# Patient Record
Sex: Female | Born: 1990 | Race: Black or African American | Hispanic: No | Marital: Single | Smoking: Current every day smoker
Health system: Southern US, Community
[De-identification: ages and names within clinical notes are randomized; demographics above are authoritative.]

## PROBLEM LIST (undated history)

## (undated) DIAGNOSIS — K219 Gastro-esophageal reflux disease without esophagitis: Secondary | ICD-10-CM

## (undated) DIAGNOSIS — J329 Chronic sinusitis, unspecified: Secondary | ICD-10-CM

## (undated) DIAGNOSIS — L049 Acute lymphadenitis, unspecified: Secondary | ICD-10-CM

## (undated) DIAGNOSIS — I881 Chronic lymphadenitis, except mesenteric: Secondary | ICD-10-CM

## (undated) HISTORY — PX: UVULECTOMY: SHX2631

## (undated) HISTORY — PX: LYMPHADENECTOMY: SHX15

## (undated) HISTORY — DX: Gastro-esophageal reflux disease without esophagitis: K21.9

---
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
## 2010-01-26 ENCOUNTER — Emergency Department (HOSPITAL_COMMUNITY): Admission: EM | Admit: 2010-01-26 | Discharge: 2010-01-27 | Payer: Self-pay | Admitting: Emergency Medicine

## 2010-03-03 ENCOUNTER — Emergency Department (HOSPITAL_COMMUNITY): Admission: EM | Admit: 2010-03-03 | Discharge: 2010-03-03 | Payer: Self-pay | Admitting: Emergency Medicine

## 2010-11-21 ENCOUNTER — Emergency Department (HOSPITAL_COMMUNITY)
Admission: EM | Admit: 2010-11-21 | Discharge: 2010-11-21 | Payer: Self-pay | Source: Home / Self Care | Admitting: Emergency Medicine

## 2010-11-24 LAB — URINALYSIS, ROUTINE W REFLEX MICROSCOPIC
Bilirubin Urine: NEGATIVE
Hgb urine dipstick: NEGATIVE
Ketones, ur: NEGATIVE mg/dL
Nitrite: NEGATIVE
Protein, ur: NEGATIVE mg/dL
Specific Gravity, Urine: 1.024 (ref 1.005–1.030)
Urine Glucose, Fasting: NEGATIVE mg/dL
Urobilinogen, UA: 1 mg/dL (ref 0.0–1.0)
pH: 7.5 (ref 5.0–8.0)

## 2010-11-24 LAB — URINE MICROSCOPIC-ADD ON

## 2010-11-24 LAB — POCT PREGNANCY, URINE: Preg Test, Ur: NEGATIVE

## 2010-11-29 ENCOUNTER — Encounter: Payer: Self-pay | Admitting: Interventional Radiology

## 2011-02-01 LAB — POCT I-STAT, CHEM 8
BUN: 8 mg/dL (ref 6–23)
Calcium, Ion: 1.13 mmol/L (ref 1.12–1.32)
Chloride: 108 mEq/L (ref 96–112)
Creatinine, Ser: 0.6 mg/dL (ref 0.4–1.2)
Glucose, Bld: 83 mg/dL (ref 70–99)
HCT: 40 % (ref 36.0–46.0)
Hemoglobin: 13.6 g/dL (ref 12.0–15.0)
Potassium: 3.8 mEq/L (ref 3.5–5.1)
Sodium: 140 mEq/L (ref 135–145)
TCO2: 26 mmol/L (ref 0–100)

## 2011-02-01 LAB — RPR: RPR Ser Ql: NONREACTIVE

## 2011-02-01 LAB — GC/CHLAMYDIA PROBE AMP, GENITAL
Chlamydia, DNA Probe: NEGATIVE
GC Probe Amp, Genital: NEGATIVE

## 2011-02-01 LAB — URINE CULTURE: Colony Count: 80000

## 2011-02-01 LAB — WET PREP, GENITAL
Clue Cells Wet Prep HPF POC: NONE SEEN
Trich, Wet Prep: NONE SEEN
Yeast Wet Prep HPF POC: NONE SEEN

## 2011-02-01 LAB — POCT PREGNANCY, URINE: Preg Test, Ur: NEGATIVE

## 2011-03-27 NOTE — Op Note (Signed)
[name], [name] NO.:  [id number]   MEDICAL RECORD NO.:  [id number]          PATIENT TYPE:  AMB   LOCATION:  DSC                          FACILITY:  [facility]   PHYSICIAN:  Lucky Cowboy, MD         DATE OF BIRTH:  08-10-1991   DATE OF PROCEDURE:  10/30/2005  DATE OF DISCHARGE:  10/30/2005                                 OPERATIVE REPORT   PREOPERATIVE DIAGNOSIS:  Left posterior cervical lymphadenitis.   POSTOPERATIVE DIAGNOSIS:  Left posterior cervical lymphadenitis.   PROCEDURE:  Excisional biopsy, left posterior cervical lymph node.   SURGEON:  Lucky Cowboy, M.D.   ANESTHESIA:  General endotracheal anesthesia.   ESTIMATED BLOOD LOSS:  10 cc.   SPECIMEN:  Left posterior cervical lymph node.   COMPLICATIONS:  None.   INDICATIONS:  This patient is a 20 year old female who is known to me for a  previous neck mass evaluation. In January of 2004, she was evaluated by  excisional left neck biopsy for ongoing lymphadenitis with fever. This was  returned as consistent with necrotizing lymphadenitis consistent with  Kikuchi's disease.  Eventually, the lymph nodes subsided and the patient's  condition resolved. She presented again on October 08, 2005 with  development of lower back pain and fever to 103. This developed November 14.  She became dehydrated with definite lymphadenopathy. She was then admitted  to the pediatric teaching service where multiple blood work evaluation was  performed. IV antibiotic therapy was also administered. She gradually  improved and was followed back up in the office with near resolution of  lymph nodes. Follow-up CT scan performed on October 26, 2005 revealed  resolution of concern for necrosis with some decrease in the size of the  lymph nodes. There were noted to be some prominent left axillary lymph  nodes, but there was no mediastinal are hilar adenopathy. The night of the  CT scan, the child developed a fever with left leg  swelling and pronounced  left neck adenopathy. She was seen in the office yesterday with 2.5-3-cm  left neck adenopathy from the angle of the mandible down to the  supraclavicular area. She is having low-grade fevers. For these reasons, she  presents for excisional biopsy of left posterior lymph node.   FINDINGS:  The patient was noted to have a necrotic left posterior lymph  node. Multiple other lymph nodes were palpable. This was sent for frozen  section and also permanent section evaluation. The frozen section revealed  findings consistent with necrotizing lymphadenitis as was the previous  diagnosis. This was discussed with Dr. Kieth Brightly, the pathologist. She is  going to further evaluate the remaining pathology specimen and send it for  culture and sensitivity as well as well as look for TB and other workup for  the necrotizing lymphadenitis.   DESCRIPTION OF PROCEDURE:  The patient was taken to the operating room and  placed on the table in the supine position. She was then placed under  general endotracheal anesthesia and the left neck prepped with Betadine and  draped in the usual sterile  fashion. A transverse 3-cm incision was made  using a #15 blade. Subcutaneous bleeding was controlled using Bovie cautery.  Scissors dissection was used for the remainder of the procedure. The spinal  accessory nerve was identified and preserved. The necrotic posterior  cervical lymph node was identified. It could not be dissected in whole due  to the necrotic nature. The specimens were obtained and taken for frozen  section. A larger 1-cm lymph node was obtained in this region and sent for  permanent section for lymphoma protocol. Other parcels of the lymph node  were also included in this specimen. Once the lymph node was cleaned out its  entirety, bipolar cautery was used to ensure hemostasis. The nerve was  protected its entirety. The subcutaneous tissues were reapproximated in a  simple  interrupted buried fashion. This was done with a 4-0 Vicryl. The skin  was closed in a simple running stitch using 4-0 Prolene. Bacitracin was  applied. The patient was awakened from anesthesia and taken to the  Postanesthesia Care Unit in stable condition. There were no complications.      Lucky Cowboy, MD  Electronically Signed     SJ/MEDQ  D:  10/30/2005  T:  11/02/2005  Job:  [id number]   cc:   [facility]   Marda Stalker, M.D.   Gerrianne Scale, M.D.  Fax: [phone]   Irineo Axon, M.D.  Pediatric Infectious Disease  [facility]  [facility]

## 2011-03-27 NOTE — Discharge Summary (Signed)
NAMESHIANE, WENBERG NO.:  [id number]   MEDICAL RECORD NO.:  [id number]          PATIENT TYPE:  INP   LOCATION:  6148                         FACILITY:  [facility]   PHYSICIAN:  Clint Lipps, PediatricsDATE OF BIRTH:  Jul 24, 1991   DATE OF ADMISSION:  10/08/2005  DATE OF DISCHARGE:  10/12/2005                                 DISCHARGE SUMMARY   Dictation ended at this point.           ______________________________  Clint Lipps, Pediatrics     SK/MEDQ  D:  10/12/2005  T:  10/13/2005  Job:  [id number]

## 2011-03-27 NOTE — Discharge Summary (Signed)
[name], Burnett NO.:  [id number]   MEDICAL RECORD NO.:  [id number]          PATIENT TYPE:  INP   LOCATION:  6148                         FACILITY:  [facility]   PHYSICIAN:  Clint Lipps, M.D.    DATE OF BIRTH:  [birth year]   DATE OF ADMISSION:  10/08/2005  DATE OF DISCHARGE:  10/12/2005                                 DISCHARGE SUMMARY   REASON FOR HOSPITALIZATION:  Fever of unknown origin, cervical  lymphadenopathy, and weight loss of ten pounds for 12 days.   HOSPITAL COURSE:  LABORATORY DATA:  CBC:  White blood count 3.9, hemoglobin  11.1, hematocrit 32.7, platelets 265,000, neutrophils 39%, lymphocytes 46%,  monocytes 15%.  Chem-10 with LFT, total bilirubin, total protein, albumin,  uric acid, ACE, T4, TSH, CRP within normal limits.  ESR 56.  Urinalysis:  pH  6.5, specific gravity 1.040, moderate leukocytes, ketones, high  urobilinogen, few bacteria.  TB skin test negative.  Rheumatoid factor  elevated with 53, AMA positive with a titer of 1:80, speckled pattern.  EBV  studies negative.  RNP antibodies, EGG, SSB (LA antibodies), scleroderma  antibodies, JO-1 antibodies negative.  SSA (RO antibodies) greater than 8.0  (range less than 1.0.)  Blood culture from October 08, 2005, pending.  Urine culture from October 08, 2005, shows 80,000 colonies/ml (clean  catch).   After she was found to have a pneumomediastinum on CT of the neck on  October 08, 2005, that was performed to evaluate her cervical  lymphadenopathy, she was admitted to the Pediatric Service at [facility].  A  chest x-ray, PA and lateral views, showed no active cardiopulmonary disease  and no pneumomediastinum.   On admission, her temperature was 39.3 degrees Celsius, which resolved  spontaneously without medication.  The last time she was febrile was around  1:00 a.m. on October 10, 2005, with a temperature of 38.2 degrees Celsius  which responded to Tylenol.  She also received maintenance  intravenous  fluids initially due to decreased appetite and decreased p.o. intake for  days.   Her labs were remarkable for an elevated __________, positive rheumatoid  factor, antinuclear antibodies with speckled pattern, and SSA (RO  antibodies) greater than 8.0.  Other differential diagnoses like thyroid  disorder, tuberculosis, or sarcoidosis appear to be less likely based on the  laboratory results.   [name] underwent an ultrasound-guided cervical lymph node biopsy on the left  on October 12, 2005.  Culture and pathology results pending.   FINAL DIAGNOSES:  1.  Fever of unknown origin, cervical lymphadenopathy, and weight loss for      12 days.  2.  Status post surgical removal of necrotic uvula and history of __________      disease in January 2004.  3.  Culture and pathology results from lymph node biopsy are pending.   DISCHARGE MEDICATIONS AND INSTRUCTIONS:  1.  Tylenol 650 mg by mouth every 6 hours p.r.n. for pain control after      biopsy.  2.  Follow-up appointments.  She will be excused from school until her      follow-up  appointment with Dr. Orson Aloe on October 14, 2005, that the      family will schedule.  She will also have follow-up appointments with      Dr. Lucky Cowboy from ENT and a rheumatologist to further evaluate those      above-mentioned laboratory      findings.  ___________.  We will arrange for follow-up appointments      tomorrow and notify the family.  3.  Discharge weight:  63.2 kg.   DISCHARGE CONDITION:  Improved.  No fevers since October 10, 2005, good p.o.  intake.           ______________________________  Clint Lipps, M.D.     SK/MEDQ  D:  10/12/2005  T:  10/13/2005  Job:  [id number]   cc:   Geri Seminole, MD  Fax: [phone]   Rheumatologist to be named

## 2011-04-04 ENCOUNTER — Emergency Department (HOSPITAL_COMMUNITY)
Admission: EM | Admit: 2011-04-04 | Discharge: 2011-04-04 | Disposition: A | Discharge status: Home / Self Care | Payer: Self-pay | Attending: Emergency Medicine | Admitting: Emergency Medicine

## 2011-04-04 DIAGNOSIS — M79609 Pain in unspecified limb: Secondary | ICD-10-CM | POA: Insufficient documentation

## 2011-04-04 LAB — POCT I-STAT, CHEM 8
BUN: <3 mg/dL — ABNORMAL LOW (ref 6–23)
Calcium, Ion: 1.09 mmol/L — ABNORMAL LOW (ref 1.12–1.32)
Chloride: 106 mEq/L (ref 96–112)
Creatinine, Ser: 0.7 mg/dL (ref 0.4–1.2)
Glucose, Bld: 90 mg/dL (ref 70–99)
HCT: 39 % (ref 36.0–46.0)
Hemoglobin: 13.3 g/dL (ref 12.0–15.0)
Potassium: 3.5 mEq/L (ref 3.5–5.1)
Sodium: 142 mEq/L (ref 135–145)
TCO2: 24 mmol/L (ref 0–100)

## 2012-06-08 ENCOUNTER — Emergency Department (HOSPITAL_COMMUNITY)
Admission: EM | Admit: 2012-06-08 | Discharge: 2012-06-08 | Disposition: A | Discharge status: Home / Self Care | Payer: Self-pay | Attending: Emergency Medicine | Admitting: Emergency Medicine

## 2012-06-08 ENCOUNTER — Encounter (HOSPITAL_COMMUNITY): Payer: Self-pay

## 2012-06-08 DIAGNOSIS — F17200 Nicotine dependence, unspecified, uncomplicated: Secondary | ICD-10-CM | POA: Insufficient documentation

## 2012-06-08 DIAGNOSIS — R599 Enlarged lymph nodes, unspecified: Secondary | ICD-10-CM

## 2012-06-08 DIAGNOSIS — R42 Dizziness and giddiness: Secondary | ICD-10-CM | POA: Insufficient documentation

## 2012-06-08 DIAGNOSIS — R591 Generalized enlarged lymph nodes: Secondary | ICD-10-CM

## 2012-06-08 MED ORDER — HYDROCODONE-ACETAMINOPHEN 5-325 MG PO TABS: 1.0000 | ORAL_TABLET | ORAL | Status: AC | PRN

## 2012-06-08 NOTE — ED Notes (Signed)
Patient c/o sore throat and swelling, frequent dizziness, and one syncopal episode. 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 No problems with breathing.

## 2012-06-08 NOTE — ED Provider Notes (Signed)
History     CSN: [id number]  Arrival date & time 06/08/12  1231   First MD Initiated Contact with Patient 06/08/12 1325      Chief Complaint  Patient presents with  . Oral Swelling    throat lymph node swelling  . Dizziness    (Consider location/radiation/quality/duration/timing/severity/associated sxs/prior treatment) HPI Comments: [name] is a 21 y.o. Female  who presents with several days of swelling and nodules in her left neck. She has mild, associated dizziness and weakness. She's not had any syncope. She is eating well. She has no fever, or chills, nausea, or vomiting. She has a history of a benign cervical adenopathy. 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 She's tried over-the-counter analgesics for relief. The   The history is provided by the patient.    History reviewed. No pertinent past medical history.  Past Surgical History  Procedure Date  . Uvulectomy   . Lymphadenectomy     Family History  Problem Relation Age of Onset  . Diabetes Mother   . Hypertension Father   . Cancer Brother   . Heart failure Other     History  Substance Use Topics  . Smoking status: Current Everyday Smoker -- 0.5 packs/day    Types: Cigarettes  . Smokeless tobacco: Never Used  . Alcohol Use: Yes     occasinally    OB History    Grav Para Term Preterm Abortions TAB SAB Ect Mult Living                  Review of Systems  All other systems reviewed and are negative.    Allergies  Review of patient's allergies indicates no known allergies.  Home Medications   Current Outpatient Rx  Name Route Sig Dispense Refill  . ACETAMINOPHEN 500 MG PO TABS Oral Take 1,000 mg by mouth every 4 (four) hours as needed. For pain.    . BC HEADACHE POWDER PO Oral Take 1 packet by mouth every 4 (four) hours as needed. For pain.    Marland Kitchen ANIMAL SHAPES WITH C & FA PO CHEW Oral Chew 1 tablet by mouth daily.    Marland Kitchen HYDROCODONE-ACETAMINOPHEN 5-325 MG PO TABS Oral Take 1 tablet by mouth  every 4 (four) hours as needed for pain. 20 tablet 0    BP 122/79  Pulse 78  Temp 98.1 F (36.7 C) (Oral)  Resp 16  Ht 5\' 8"  (1.727 m)  SpO2 100%  LMP 05/14/2012  Physical Exam  Nursing note and vitals reviewed. Constitutional: She is oriented to person, place, and time. She appears well-developed and well-nourished.  HENT:  Head: Normocephalic and atraumatic.       No trismus  Eyes: Conjunctivae and EOM are normal. Pupils are equal, round, and reactive to light.  Neck: Normal range of motion and phonation normal. Neck supple.  Cardiovascular: Normal rate, regular rhythm and intact distal pulses.   Pulmonary/Chest: Effort normal and breath sounds normal. She exhibits no tenderness.  Abdominal: Soft.  Musculoskeletal: Normal range of motion.       Normal neck and back motion  Lymphadenopathy:       Left neck adenopathy ranging from 2-4 cm, multiple, without overlying skin defect, or significant tenderness to palpation.  Neurological: She is alert and oriented to person, place, and time. She has normal strength. She exhibits normal muscle tone.  Skin: Skin is warm and dry.  Psychiatric: She has a normal mood and affect. Her behavior is normal. Judgment and thought  content normal.    ED Course  Procedures (including critical care time)        Labs Reviewed - No data to display No results found.   1. 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 The patient is nontoxic, with normal vital signs, and no worrisome complaints. She is stable for discharge.      Plan: Home Medications- Norco; Home Treatments- rest; Recommended follow up- her prior doctor  Flint Melter, MD 06/08/12 1609

## 2012-08-01 ENCOUNTER — Encounter (HOSPITAL_COMMUNITY): Payer: Self-pay | Admitting: *Deleted

## 2012-08-01 ENCOUNTER — Emergency Department (INDEPENDENT_AMBULATORY_CARE_PROVIDER_SITE_OTHER)
Admission: EM | Admit: 2012-08-01 | Discharge: 2012-08-01 | Disposition: A | Discharge status: Home / Self Care | Payer: Self-pay | Source: Home / Self Care | Attending: Family Medicine | Admitting: Family Medicine

## 2012-08-01 DIAGNOSIS — R42 Dizziness and giddiness: Secondary | ICD-10-CM

## 2012-08-01 LAB — POCT URINALYSIS DIP (DEVICE)
Glucose, UA: 100 mg/dL — AB
Hgb urine dipstick: NEGATIVE
Ketones, ur: 40 mg/dL — AB
Leukocytes, UA: NEGATIVE
Nitrite: NEGATIVE
Protein, ur: 30 mg/dL — AB
Specific Gravity, Urine: 1.025 (ref 1.005–1.030)
Urobilinogen, UA: 2 mg/dL — ABNORMAL HIGH (ref 0.0–1.0)
pH: 6.5 (ref 5.0–8.0)

## 2012-08-01 LAB — POCT PREGNANCY, URINE: Preg Test, Ur: NEGATIVE

## 2012-08-01 MED ORDER — MECLIZINE HCL 25 MG PO TABS: 25.0000 mg | ORAL_TABLET | Freq: Four times a day (QID) | ORAL | Status: DC

## 2012-08-01 NOTE — ED Notes (Signed)
Pt reports      symotons   Of  Dizzyness  With  Pain r  Shoulder          For  What   She  Describes  As  sev   Months    She  Also  Reports  She    May not be drinking  Enough  Water  According to  Patients  Mother  -   The patient is  Awake  As  Well  As  Alert   She is  Oriented   Her  Skin is  Warm  And  Dry  She  Reports  Her  Period is  Late and  It is  Irregular

## 2012-08-01 NOTE — ED Provider Notes (Signed)
History     CSN: [id number]  Arrival date & time 08/01/12  1526   First MD Initiated Contact with Patient 08/01/12 1639      Chief Complaint  Patient presents with  . Dizziness    (Consider location/radiation/quality/duration/timing/severity/associated sxs/prior treatment) HPI Comments: C/o brief episodes of dizziness over the past month.  2-3 episodes daily lasting a few seconds.  No recent fever or viral sxs..  No head trauma.    No nausea , vomiting or diarrhea.  "not drinking as much water as i used to".    No UTI sxs.  The history is provided by the patient. No language interpreter was used.    History reviewed. No pertinent past medical history.  Past Surgical History  Procedure Date  . Uvulectomy   . Lymphadenectomy     Family History  Problem Relation Age of Onset  . Diabetes Mother   . Hypertension Father   . Cancer Brother   . Heart failure Other     History  Substance Use Topics  . Smoking status: Current Every Day Smoker -- 0.5 packs/day    Types: Cigarettes  . Smokeless tobacco: Never Used  . Alcohol Use: Yes     occasinally    OB History    Grav Para Term Preterm Abortions TAB SAB Ect Mult Living                  Review of Systems  Constitutional: Negative for fever and chills.  HENT: Negative for ear pain, neck pain and ear discharge.   Gastrointestinal: Negative for nausea, vomiting, abdominal pain and constipation.  Genitourinary: Negative for dysuria, urgency, frequency and hematuria.  Neurological: Positive for dizziness and light-headedness. Negative for seizures, syncope, facial asymmetry, speech difficulty, weakness and headaches.  All other systems reviewed and are negative.    Allergies  Review of patient's allergies indicates no known allergies.  Home Medications   Current Outpatient Rx  Name Route Sig Dispense Refill  . ACETAMINOPHEN 500 MG PO TABS Oral Take 1,000 mg by mouth every 4 (four) hours as needed. For pain.    .  BC HEADACHE POWDER PO Oral Take 1 packet by mouth every 4 (four) hours as needed. For pain.    Marland Kitchen ANIMAL SHAPES WITH C & FA PO CHEW Oral Chew 1 tablet by mouth daily.      BP 98/68  Pulse 104  Temp 99.1 F (37.3 C) (Oral)  Resp 18  SpO2 100%  LMP 05/09/2012  Physical Exam  Nursing note and vitals reviewed. Constitutional: She is oriented to person, place, and time. She appears well-developed and well-nourished. No distress.  HENT:  Head: Normocephalic and atraumatic.  Right Ear: External ear normal.  Left Ear: External ear normal.  Eyes: EOM are normal. Right eye exhibits no nystagmus. Left eye exhibits no nystagmus.       hallpike maneuver negative x 2   Neck: Normal range of motion.  Cardiovascular: Normal rate, regular rhythm and normal heart sounds.   Pulmonary/Chest: Effort normal and breath sounds normal.  Abdominal: Soft. She exhibits no distension. There is no tenderness.  Musculoskeletal: Normal range of motion.  Neurological: She is alert and oriented to person, place, and time. No cranial nerve deficit or sensory deficit. Coordination and gait normal. GCS eye subscore is 4. GCS verbal subscore is 5. GCS motor subscore is 6.  Skin: Skin is warm and dry.  Psychiatric: She has a normal mood and affect. Judgment normal.  ED Course  Procedures (including critical care time)  Labs Reviewed  POCT URINALYSIS DIP (DEVICE) - Abnormal; Notable for the following:    Glucose, UA 100 (*)     Bilirubin Urine MODERATE (*)     Ketones, ur 40 (*)     Protein, ur 30 (*)     Urobilinogen, UA 2.0 (*)     All other components within normal limits  POCT PREGNANCY, URINE   No results found.   1. Vertigo       MDM  rx-antivert 25 mg, 28 Increase fluid intake.   Return if sxs worsen or change significantly.        Evalina Field, [place] 08/01/12 1728

## 2012-08-05 NOTE — ED Provider Notes (Signed)
Medical screening examination/treatment/procedure(s) were performed by resident physician or non-physician practitioner and as supervising physician I was immediately available for consultation/collaboration.   Barkley Bruns MD.    Linna Hoff, MD 08/05/12 [phone]

## 2013-11-10 ENCOUNTER — Emergency Department (HOSPITAL_COMMUNITY): Payer: Self-pay

## 2013-11-10 ENCOUNTER — Encounter (HOSPITAL_COMMUNITY): Payer: Self-pay | Admitting: Emergency Medicine

## 2013-11-10 ENCOUNTER — Emergency Department (HOSPITAL_COMMUNITY)
Admission: EM | Admit: 2013-11-10 | Discharge: 2013-11-10 | Disposition: A | Discharge status: Home / Self Care | Payer: Self-pay | Attending: Emergency Medicine | Admitting: Emergency Medicine

## 2013-11-10 DIAGNOSIS — R69 Illness, unspecified: Secondary | ICD-10-CM

## 2013-11-10 DIAGNOSIS — F17200 Nicotine dependence, unspecified, uncomplicated: Secondary | ICD-10-CM | POA: Insufficient documentation

## 2013-11-10 DIAGNOSIS — Z862 Personal history of diseases of the blood and blood-forming organs and certain disorders involving the immune mechanism: Secondary | ICD-10-CM | POA: Insufficient documentation

## 2013-11-10 DIAGNOSIS — R52 Pain, unspecified: Secondary | ICD-10-CM | POA: Insufficient documentation

## 2013-11-10 DIAGNOSIS — IMO0001 Reserved for inherently not codable concepts without codable children: Secondary | ICD-10-CM | POA: Insufficient documentation

## 2013-11-10 DIAGNOSIS — R110 Nausea: Secondary | ICD-10-CM | POA: Insufficient documentation

## 2013-11-10 DIAGNOSIS — R51 Headache: Secondary | ICD-10-CM | POA: Insufficient documentation

## 2013-11-10 DIAGNOSIS — R599 Enlarged lymph nodes, unspecified: Secondary | ICD-10-CM | POA: Insufficient documentation

## 2013-11-10 DIAGNOSIS — J111 Influenza due to unidentified influenza virus with other respiratory manifestations: Secondary | ICD-10-CM

## 2013-11-10 DIAGNOSIS — Z79899 Other long term (current) drug therapy: Secondary | ICD-10-CM | POA: Insufficient documentation

## 2013-11-10 HISTORY — DX: Acute lymphadenitis, unspecified: L04.9

## 2013-11-10 HISTORY — DX: Chronic lymphadenitis, except mesenteric: I88.1

## 2013-11-10 LAB — BASIC METABOLIC PANEL
BUN: 10 mg/dL (ref 6–23)
CALCIUM: 9 mg/dL (ref 8.4–10.5)
CO2: 21 meq/L (ref 19–32)
Chloride: 101 mEq/L (ref 96–112)
Creatinine, Ser: 0.77 mg/dL (ref 0.50–1.10)
GFR calc Af Amer: >90 mL/min (ref >90)
GFR calc non Af Amer: >90 mL/min (ref >90)
GLUCOSE: 83 mg/dL (ref 70–99)
Potassium: 4.5 mEq/L (ref 3.7–5.3)
SODIUM: 136 meq/L — AB (ref 137–147)

## 2013-11-10 LAB — CBC WITH DIFFERENTIAL/PLATELET
Basophils Absolute: 0 10*3/uL (ref 0.0–0.1)
Basophils Relative: 1 % (ref 0–1)
EOS PCT: 1 % (ref 0–5)
Eosinophils Absolute: 0 10*3/uL (ref 0.0–0.7)
HEMATOCRIT: 38.3 % (ref 36.0–46.0)
Hemoglobin: 12.4 g/dL (ref 12.0–15.0)
LYMPHS ABS: 1.4 10*3/uL (ref 0.7–4.0)
LYMPHS PCT: 37 % (ref 12–46)
MCH: 24.9 pg — ABNORMAL LOW (ref 26.0–34.0)
MCHC: 32.4 g/dL (ref 30.0–36.0)
MCV: 76.9 fL — AB (ref 78.0–100.0)
MONO ABS: 0.4 10*3/uL (ref 0.1–1.0)
Monocytes Relative: 9 % (ref 3–12)
Neutro Abs: 1.9 10*3/uL (ref 1.7–7.7)
Neutrophils Relative %: 52 % (ref 43–77)
PLATELETS: 196 10*3/uL (ref 150–400)
RBC: 4.98 MIL/uL (ref 3.87–5.11)
RDW: 14.3 % (ref 11.5–15.5)
WBC: 3.7 10*3/uL — AB (ref 4.0–10.5)

## 2013-11-10 LAB — CG4 I-STAT (LACTIC ACID): Lactic Acid, Venous: 0.94 mmol/L (ref 0.5–2.2)

## 2013-11-10 MED ORDER — BENZONATATE 100 MG PO CAPS: 100.0000 mg | ORAL_CAPSULE | Freq: Three times a day (TID) | ORAL | Status: DC

## 2013-11-10 MED ORDER — NAPROXEN 500 MG PO TABS: 500.0000 mg | ORAL_TABLET | Freq: Two times a day (BID) | ORAL | Status: DC

## 2013-11-10 MED ORDER — ACETAMINOPHEN 325 MG PO TABS
650.0000 mg | ORAL_TABLET | Freq: Once | ORAL | Status: AC
  Administered 2013-11-10: 650 mg via ORAL
  Filled 2013-11-10: qty 2

## 2013-11-10 NOTE — Discharge Instructions (Signed)
Please read and follow all provided instructions.  Your diagnoses today include:  1. Influenza-like illness     Tests performed today include:  Blood counts and electrolytes - normal  Chest x-ray - no pneumonia  Vital signs. See below for your results today.   Medications prescribed:   Tessalon Perles - cough suppressant medication   Naproxen - anti-inflammatory pain medication  Do not exceed 500mg  naproxen every 12 hours, take with food  You have been prescribed an anti-inflammatory medication or NSAID. Take with food. Take smallest effective dose for the shortest duration needed for your pain. Stop taking if you experience stomach pain or vomiting.   Take any prescribed medications only as directed.  Home care instructions:  Follow any educational materials contained in this packet. Please continue drinking plenty of fluids. Use over-the-counter cold and flu medications as needed as directed on packaging for symptom relief. You may also use ibuprofen or tylenol as directed on packaging for pain or fever.   BE VERY CAREFUL not to take multiple medicines containing Tylenol (also called acetaminophen). Doing so can lead to an overdose which can damage your liver and cause liver failure and possibly death.   Follow-up instructions: Please follow-up with your primary care provider in the next 3 days for further evaluation of your symptoms. If you do not have a primary care doctor -- see below for referral information.   Return instructions:   Please return to the Emergency Department if you experience worsening symptoms.  Please return if you have a high fever greater than 101 degrees not controlled with over-the-counter medications, persistent vomiting and cannot keep down fluids, or worsening trouble breathing.  Please return if you have any other emergent concerns.  Additional Information:  Your vital signs today were: BP 122/72   Pulse 108   Temp(Src) 100.9 F (38.3 C)  (Oral)   Resp 20   Wt 209 lb 2 oz (94.858 kg)   SpO2 100%   LMP 09/07/2013 If your blood pressure (BP) was elevated above 135/85 this visit, please have this repeated by your doctor within one month.

## 2013-11-10 NOTE — ED Notes (Signed)
Pt reports has had migraine for few weeks. 1 week ago began to have cold, cough, congestion, body aches and fever. Pt has hx of autoimmune disease and states her body can't fight off infection as well. Also reports nausea and diarrhea x 2 days.

## 2013-11-10 NOTE — ED Provider Notes (Signed)
CSN: [id number]     Arrival date & time 11/10/13  1247 History   First MD Initiated Contact with Patient 11/10/13 1403     Chief Complaint  Patient presents with  . Fever  . 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 She has had nausea and diarrhea for the past 2 days. She first was told she had a fever today. Upon arrival to the emergency department, her temperature was 100.18F. Patient has been using over-the-counter Tylenol and aspirin for headache. She has not vomited. No urinary symptoms. She did not have a flu shot this year. Onset of symptoms gradual. Course is constant. Nothing makes symptoms better or worse.  Patient is a 23 y.o. female presenting with fever. The history is provided by the patient.  Fever Associated symptoms: chills, congestion, cough, headaches, myalgias, nausea and rhinorrhea   Associated symptoms: no diarrhea, no dysuria, no ear pain, no rash, no sore throat and no vomiting     Past Medical History  Diagnosis Date  . Kikuchi disease    Past Surgical History  Procedure Laterality Date  . Uvulectomy    . Lymphadenectomy     Family History  Problem Relation Age of Onset  . Diabetes Mother   . Hypertension Father   . Cancer Brother   . Heart failure Other    History  Substance Use Topics  . Smoking status: Current Every Day Smoker -- 0.50 packs/day    Types: Cigarettes  . Smokeless tobacco: Never Used  . Alcohol Use: Yes     Comment: occasinally   OB History   Grav Para Term Preterm Abortions TAB SAB Ect Mult Living                 Review of Systems  Constitutional: Positive for fever, chills and appetite change. Negative for fatigue.  HENT: Positive for congestion and rhinorrhea. Negative for ear pain, sinus  pressure and sore throat.   Eyes: Negative for redness.  Respiratory: Positive for cough. Negative for shortness of breath and wheezing.   Gastrointestinal: Positive for nausea. Negative for vomiting, abdominal pain and diarrhea.  Genitourinary: Negative for dysuria.  Musculoskeletal: Positive for myalgias. Negative for neck stiffness.  Skin: Negative for rash.  Neurological: Positive for headaches.  Hematological: Positive for adenopathy (stable).    Allergies  Review of patient's allergies indicates no known allergies.  Home Medications   Current Outpatient Rx  Name  Route  Sig  Dispense  Refill  . acetaminophen (TYLENOL) 500 MG tablet   Oral   Take 1,000 mg by mouth every 4 (four) hours as needed. For pain.         . Aspirin-Salicylamide-Caffeine (BC HEADACHE POWDER PO)   Oral   Take 1 packet by mouth every 4 (four) hours as needed. For pain.         . meclizine (ANTIVERT) 25 MG tablet   Oral   Take 1 tablet (25 mg total) by mouth 4 (four) times daily.   28 tablet   0   . Pediatric Multiple Vit-C-FA (MULTIVITAMIN ANIMAL SHAPES, WITH CA/FA,) WITH C & FA CHEW   Oral   Chew 1 tablet by mouth daily.          BP 122/72  Pulse 108  Temp(Src) 100.9 F (38.3 C) (Oral)  Resp 20  Wt 209 lb  2 oz (94.858 kg)  SpO2 100% Physical Exam  Nursing note and vitals reviewed. Constitutional: She appears well-developed and well-nourished.  HENT:  Head: Normocephalic and atraumatic.  Right Ear: Tympanic membrane, external ear and ear canal normal.  Left Ear: Tympanic membrane, external ear and ear canal normal.  Nose: Mucosal edema and rhinorrhea present.  Mouth/Throat: Uvula is midline, oropharynx is clear and moist and mucous membranes are normal. No oropharyngeal exudate, posterior oropharyngeal edema, posterior oropharyngeal erythema or tonsillar abscesses.  Eyes: Conjunctivae are normal. Right eye exhibits no discharge. Left eye exhibits no discharge.  Neck: Normal range  of motion. Neck supple.  Cardiovascular: Normal rate, regular rhythm and normal heart sounds.   Pulmonary/Chest: Effort normal and breath sounds normal.  Abdominal: Soft. There is no tenderness.  Neurological: She is alert.  Skin: Skin is warm and dry.  Psychiatric: She has a normal mood and affect.    ED Course  Procedures (including critical care time) Labs Review Labs Reviewed  CBC WITH DIFFERENTIAL - Abnormal; Notable for the following:    WBC 3.7 (*)    MCV 76.9 (*)    MCH 24.9 (*)    All other components within normal limits  BASIC METABOLIC PANEL - Abnormal; Notable for the following:    Sodium 136 (*)    All other components within normal limits  CG4 I-STAT (LACTIC ACID)   Imaging Review Dg Chest 2 View  11/10/2013   CLINICAL DATA:  Fever, generalized body aches  EXAM: CHEST  2 VIEW  COMPARISON:  CT chest 10/26/2005  FINDINGS: The heart size and mediastinal contours are within normal limits. Both lungs are clear. The visualized skeletal structures are unremarkable.  IMPRESSION: No active cardiopulmonary disease.   Electronically Signed   By: Elige Ko   On: 11/10/2013 14:53    EKG Interpretation   None      2:15 PM Patient seen and examined. Work-up initiated. Medications ordered.   Vital signs reviewed and are as follows: Filed Vitals:   11/10/13 1252  BP: 122/72  Pulse: 108  Temp: 100.9 F (38.3 C)  Resp: 20   Pt informed of results. She is doing well. Tylenol given prior to discharge.   Patient counseled on supportive care for influenza and s/s to return including worsening symptoms, persistent fever, persistent vomiting, or if they have any other concerns.  Urged to see PCP if symptoms persist for more than 3 days. Patient verbalizes understanding and agrees with plan.   MDM   1. Influenza-like illness    Patient with symptoms consistent with influenza.  Vitals are stable, low-grade fever.  No signs of dehydration, tolerating PO's.  Lungs are clear.   Supportive therapy indicated with return if symptoms worsen.  Patient counseled.  Tamiflu not given due to symptoms > 48 hrs, unproven efficacy. Patient has reported autoimmune dx but she is not on any immunosuppressants.   She appears well, non-toxic, appropriate for supportive care as outpatient.       Renne Crigler, PA-C 11/11/13 [phone]

## 2013-11-13 NOTE — ED Provider Notes (Signed)
Medical screening examination/treatment/procedure(s) were performed by non-physician practitioner and as supervising physician I was immediately available for consultation/collaboration.  EKG Interpretation   None         Audree CamelScott T Shan Valdes, MD 11/13/13 [phone]

## 2013-11-19 ENCOUNTER — Emergency Department (HOSPITAL_COMMUNITY)
Admission: EM | Admit: 2013-11-19 | Discharge: 2013-11-20 | Disposition: A | Discharge status: Home / Self Care | Payer: Self-pay | Attending: Emergency Medicine | Admitting: Emergency Medicine

## 2013-11-19 ENCOUNTER — Emergency Department (HOSPITAL_COMMUNITY): Payer: Self-pay

## 2013-11-19 ENCOUNTER — Encounter (HOSPITAL_COMMUNITY): Payer: Self-pay | Admitting: Emergency Medicine

## 2013-11-19 DIAGNOSIS — R51 Headache: Secondary | ICD-10-CM

## 2013-11-19 DIAGNOSIS — R519 Headache, unspecified: Secondary | ICD-10-CM

## 2013-11-19 DIAGNOSIS — J323 Chronic sphenoidal sinusitis: Secondary | ICD-10-CM | POA: Insufficient documentation

## 2013-11-19 DIAGNOSIS — Z862 Personal history of diseases of the blood and blood-forming organs and certain disorders involving the immune mechanism: Secondary | ICD-10-CM | POA: Insufficient documentation

## 2013-11-19 DIAGNOSIS — Z79899 Other long term (current) drug therapy: Secondary | ICD-10-CM | POA: Insufficient documentation

## 2013-11-19 DIAGNOSIS — Z791 Long term (current) use of non-steroidal anti-inflammatories (NSAID): Secondary | ICD-10-CM | POA: Insufficient documentation

## 2013-11-19 DIAGNOSIS — J322 Chronic ethmoidal sinusitis: Secondary | ICD-10-CM | POA: Insufficient documentation

## 2013-11-19 DIAGNOSIS — J329 Chronic sinusitis, unspecified: Secondary | ICD-10-CM

## 2013-11-19 DIAGNOSIS — R42 Dizziness and giddiness: Secondary | ICD-10-CM | POA: Insufficient documentation

## 2013-11-19 DIAGNOSIS — F17200 Nicotine dependence, unspecified, uncomplicated: Secondary | ICD-10-CM | POA: Insufficient documentation

## 2013-11-19 MED ORDER — DIPHENHYDRAMINE HCL 50 MG/ML IJ SOLN
25.0000 mg | Freq: Once | INTRAMUSCULAR | Status: AC
  Administered 2013-11-19: 25 mg via INTRAVENOUS
  Filled 2013-11-19: qty 1

## 2013-11-19 MED ORDER — AMOXICILLIN-POT CLAVULANATE 500-125 MG PO TABS
1.0000 | ORAL_TABLET | Freq: Three times a day (TID) | ORAL | Status: DC
Start: 2013-11-19 — End: 2013-12-11

## 2013-11-19 MED ORDER — PROCHLORPERAZINE EDISYLATE 5 MG/ML IJ SOLN
10.0000 mg | Freq: Once | INTRAMUSCULAR | Status: AC
  Administered 2013-11-19: 10 mg via INTRAVENOUS
  Filled 2013-11-19: qty 2

## 2013-11-19 MED ORDER — IBUPROFEN 600 MG PO TABS: 600.0000 mg | ORAL_TABLET | Freq: Four times a day (QID) | ORAL | Status: DC | PRN

## 2013-11-19 MED ORDER — ACETAMINOPHEN 500 MG PO TABS
1000.0000 mg | ORAL_TABLET | Freq: Once | ORAL | Status: AC
  Administered 2013-11-19: 1000 mg via ORAL
  Filled 2013-11-19: qty 2

## 2013-11-19 MED ORDER — SODIUM CHLORIDE 0.9 % IV BOLUS (SEPSIS)
1000.0000 mL | INTRAVENOUS | Status: AC
  Administered 2013-11-19: 1000 mL via INTRAVENOUS

## 2013-11-19 NOTE — ED Notes (Signed)
[facility] for the same complaint. Pt has not improved.  Pt c/o of nausea after eating only.  EDPA Erin in to see pt.  Pt did not go to work today. Because of HA

## 2013-11-19 NOTE — ED Provider Notes (Signed)
CSN: [id number]     Arrival date & time 11/19/13  2008 History   First MD Initiated Contact with Patient 11/19/13 2041     Chief Complaint  Patient presents with  . Headache   (Consider location/radiation/quality/duration/timing/severity/associated sxs/prior Treatment) HPI Pt is a 22yo female with hx of Kikuchi disease when she was younger, advised at that time she would grow out of it.  Today, c/o 3-4 week hx of intermittent, gradually worsening frontal headaches that are throbbing in nature, 8/10.  Within last week headaches have become constant, associated with lightheadedness.  Reports missing 3 weeks of work due to her headaches. Denies hx of similar headaches. States she "has had 'normal' headaches"  Nothing seems to make pain worse, although extra strength tylenol did provide moderate relief initially. Reports fever of 101.4 last night.  States she was seen at [facility] ED for same on 11/10/13, records suggest pt presented with flu-like symptoms. Denies sick contacts or recent travel.  Past Medical History  Diagnosis Date  . Kikuchi disease    Past Surgical History  Procedure Laterality Date  . Uvulectomy    . Lymphadenectomy     Family History  Problem Relation Age of Onset  . Diabetes Mother   . Hypertension Father   . Cancer Brother   . Heart failure Other    History  Substance Use Topics  . Smoking status: Current Every Day Smoker -- 0.50 packs/day    Types: Cigarettes  . Smokeless tobacco: Never Used  . Alcohol Use: Yes     Comment: occasinally   OB History   Grav Para Term Preterm Abortions TAB SAB Ect Mult Living                 Review of Systems  Constitutional: Negative for fever and chills.  Gastrointestinal: Negative for nausea and vomiting.  Neurological: Positive for light-headedness and headaches. Negative for dizziness, tremors, seizures, syncope, speech difficulty, weakness and numbness.  All other systems reviewed and are negative.    Allergies   Review of patient's allergies indicates no known allergies.  Home Medications   Current Outpatient Rx  Name  Route  Sig  Dispense  Refill  . benzonatate (TESSALON) 100 MG capsule   Oral   Take 1 capsule (100 mg total) by mouth every 8 (eight) hours.   15 capsule   0   . naproxen (NAPROSYN) 500 MG tablet   Oral   Take 1 tablet (500 mg total) by mouth 2 (two) times daily.   20 tablet   0   . naproxen sodium (ANAPROX) 220 MG tablet   Oral   Take 440 mg by mouth 2 (two) times daily as needed. Pain/fever         . Pseudoephedrine-Guaifenesin (MUCINEX D PO)   Oral   Take 2 tablets by mouth every 12 (twelve) hours as needed. Cold         . amoxicillin-clavulanate (AUGMENTIN) 500-125 MG per tablet   Oral   Take 1 tablet (500 mg total) by mouth 3 (three) times daily.   21 tablet   0   . ibuprofen (ADVIL,MOTRIN) 600 MG tablet   Oral   Take 1 tablet (600 mg total) by mouth every 6 (six) hours as needed.   30 tablet   0    BP 105/65  Pulse 85  Temp(Src) 98.3 F (36.8 C) (Oral)  Resp 20  SpO2 97%  LMP 09/07/2013 Physical Exam  Nursing note and vitals reviewed.  Constitutional: She is oriented to person, place, and time. She appears well-developed and well-nourished. No distress.  Pt lying comfortably in exam bed, NAD.   HENT:  Head: Normocephalic and atraumatic.  Nose: Mucosal edema present. Right sinus exhibits frontal sinus tenderness. Right sinus exhibits no maxillary sinus tenderness. Left sinus exhibits no maxillary sinus tenderness and no frontal sinus tenderness.  Mouth/Throat: Uvula is midline, oropharynx is clear and moist and mucous membranes are normal.  Eyes: Conjunctivae are normal. No scleral icterus.  Neck: Normal range of motion.  Cardiovascular: Normal rate, regular rhythm and normal heart sounds.   Pulmonary/Chest: Effort normal and breath sounds normal. No respiratory distress. She has no wheezes. She has no rales. She exhibits no tenderness.   Abdominal: Soft. Bowel sounds are normal. She exhibits no distension and no mass. There is no tenderness. There is no rebound and no guarding.  Musculoskeletal: Normal range of motion.  Neurological: She is alert and oriented to person, place, and time. She has normal strength. No cranial nerve deficit or sensory deficit. She displays a negative Romberg sign. Coordination and gait normal. GCS eye subscore is 4. GCS verbal subscore is 5. GCS motor subscore is 6.  CN II-XII in tact, no focal deficit, nl finger to nose coordination. Nl sensation, 5/5 strength in all major muscle groups. Neg romberg and nl gait.  Skin: Skin is warm and dry. She is not diaphoretic.    ED Course  Procedures (including critical care time) Labs Review Labs Reviewed - No data to display Imaging Review Ct Head Wo Contrast  11/19/2013   CLINICAL DATA:  Frontal headaches.  EXAM: CT HEAD WITHOUT CONTRAST  TECHNIQUE: Contiguous axial images were obtained from the base of the skull through the vertex without intravenous contrast.  COMPARISON:  None.  FINDINGS: Ventricular system normal in size and appearance for age. No mass lesion. No midline shift. No acute hemorrhage or hematoma. No extra-axial fluid collections. No evidence of acute infarction. No focal brain parenchymal abnormalities.  No focal osseous abnormality involving the skull. Opacification of bilateral ethmoid air cells and mild mucosal thickening in the left sphenoid sinus. Remaining visualized paranasal sinuses, bilateral mastoid air cells, and bilateral middle ear cavities well-aerated.  IMPRESSION: 1. Normal intracranially. 2. 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   Electronically Signed   By: Hulan Saas M.D.   On: 11/19/2013 22:07    EKG Interpretation   None       MDM   1. Chronic sinusitis   2. Headache    Pt c/o 3-4 week hx of intermittent gradually worsening frontal headache which has prevented pt from going to  work.  Pt also c/o nausea.  Due to duration of worsening headache, will get head CT:  mass vs pseudotumor, concern for intracranial bleed very low.    Tx in ED: fluids, acetaminophen, compazine and benadryl.  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 Likely the cause of pt's worsening headaches.    Medications did help with headache. Pt states she feels comfortable being discharged home.   Due to recent increase in headaches, will tx with Augmentin and have f/u with ENT.   All labs/imaging/findings discussed with patient. All questions answered and concerns addressed. Will discharge pt home and have pt f/u with [facility] info provided. Return precautions given. Pt verbalized understanding and agreement with tx plan. Vitals: unremarkable. Discharged in stable condition.    Discussed pt  with attending during ED encounter and agrees with plan.          Junius FinnerErin O'Malley, PA-C 11/20/13 [phone]

## 2013-11-19 NOTE — ED Notes (Signed)
Pt arrived to the ED with a complaint of headaches.  Pt states the feeling is as if she is being hit by bricks.  Pt states that the pain has been going on intermittently for a month. Pt seen previously for same but told everything was normal.  Pt states headaches having increased in intensity

## 2013-11-20 NOTE — ED Provider Notes (Signed)
Medical screening examination/treatment/procedure(s) were performed by non-physician practitioner and as supervising physician I was immediately available for consultation/collaboration.  EKG Interpretation   None        Courtney F Horton, MD 11/20/13 0959 

## 2013-12-11 ENCOUNTER — Encounter (HOSPITAL_COMMUNITY): Payer: Self-pay | Admitting: Emergency Medicine

## 2013-12-11 ENCOUNTER — Emergency Department (HOSPITAL_COMMUNITY)
Admission: EM | Admit: 2013-12-11 | Discharge: 2013-12-11 | Disposition: A | Discharge status: Home / Self Care | Payer: Self-pay | Attending: Emergency Medicine | Admitting: Emergency Medicine

## 2013-12-11 DIAGNOSIS — R51 Headache: Secondary | ICD-10-CM | POA: Insufficient documentation

## 2013-12-11 DIAGNOSIS — H538 Other visual disturbances: Secondary | ICD-10-CM | POA: Insufficient documentation

## 2013-12-11 DIAGNOSIS — F17200 Nicotine dependence, unspecified, uncomplicated: Secondary | ICD-10-CM | POA: Insufficient documentation

## 2013-12-11 DIAGNOSIS — J3489 Other specified disorders of nose and nasal sinuses: Secondary | ICD-10-CM | POA: Insufficient documentation

## 2013-12-11 DIAGNOSIS — J329 Chronic sinusitis, unspecified: Secondary | ICD-10-CM

## 2013-12-11 DIAGNOSIS — J019 Acute sinusitis, unspecified: Secondary | ICD-10-CM | POA: Insufficient documentation

## 2013-12-11 DIAGNOSIS — R599 Enlarged lymph nodes, unspecified: Secondary | ICD-10-CM | POA: Insufficient documentation

## 2013-12-11 HISTORY — DX: Chronic sinusitis, unspecified: J32.9

## 2013-12-11 MED ORDER — HYDROCODONE-ACETAMINOPHEN 5-325 MG PO TABS: 1.0000 | ORAL_TABLET | ORAL | Status: DC | PRN

## 2013-12-11 MED ORDER — AZITHROMYCIN 250 MG PO TABS: ORAL_TABLET | ORAL | Status: DC

## 2013-12-11 MED ORDER — ONDANSETRON 4 MG PO TBDP
4.0000 mg | ORAL_TABLET | Freq: Once | ORAL | Status: AC
  Administered 2013-12-11: 4 mg via ORAL
  Filled 2013-12-11: qty 1

## 2013-12-11 MED ORDER — PREDNISONE 20 MG PO TABS: 40.0000 mg | ORAL_TABLET | Freq: Every day | ORAL | Status: DC

## 2013-12-11 MED ORDER — OXYCODONE-ACETAMINOPHEN 5-325 MG PO TABS
2.0000 | ORAL_TABLET | Freq: Once | ORAL | Status: AC
  Administered 2013-12-11: 2 via ORAL
  Filled 2013-12-11: qty 2

## 2013-12-11 NOTE — Discharge Instructions (Signed)
Sinusitis  Sinusitis is redness, soreness, and swelling (inflammation) of the paranasal sinuses. Paranasal sinuses are air pockets within the bones of your face (beneath the eyes, the middle of the forehead, or above the eyes). In healthy paranasal sinuses, mucus is able to drain out, and air is able to circulate through them by way of your nose. However, when your paranasal sinuses are inflamed, mucus and air can become trapped. This can allow bacteria and other germs to grow and cause infection.  Sinusitis can develop quickly and last only a short time (acute) or continue over a long period (chronic). Sinusitis that lasts for more than 12 weeks is considered chronic.   CAUSES   Causes of sinusitis include:  · Allergies.  · Structural abnormalities, such as displacement of the cartilage that separates your nostrils (deviated septum), which can decrease the air flow through your nose and sinuses and affect sinus drainage.  · Functional abnormalities, such as when the small hairs (cilia) that line your sinuses and help remove mucus do not work properly or are not present.  SYMPTOMS   Symptoms of acute and chronic sinusitis are the same. The primary symptoms are pain and pressure around the affected sinuses. Other symptoms include:  · Upper toothache.  · Earache.  · Headache.  · Bad breath.  · Decreased sense of smell and taste.  · A cough, which worsens when you are lying flat.  · Fatigue.  · Fever.  · Thick drainage from your nose, which often is green and may contain pus (purulent).  · Swelling and warmth over the affected sinuses.  DIAGNOSIS   Your caregiver will perform a physical exam. During the exam, your caregiver may:  · Look in your nose for signs of abnormal growths in your nostrils (nasal polyps).  · Tap over the affected sinus to check for signs of infection.  · View the inside of your sinuses (endoscopy) with a special imaging device with a light attached (endoscope), which is inserted into your  sinuses.  If your caregiver suspects that you have chronic sinusitis, one or more of the following tests may be recommended:  · Allergy tests.  · Nasal culture A sample of mucus is taken from your nose and sent to a lab and screened for bacteria.  · Nasal cytology A sample of mucus is taken from your nose and examined by your caregiver to determine if your sinusitis is related to an allergy.  TREATMENT   Most cases of acute sinusitis are related to a viral infection and will resolve on their own within 10 days. Sometimes medicines are prescribed to help relieve symptoms (pain medicine, decongestants, nasal steroid sprays, or saline sprays).   However, for sinusitis related to a bacterial infection, your caregiver will prescribe antibiotic medicines. These are medicines that will help kill the bacteria causing the infection.   Rarely, sinusitis is caused by a fungal infection. In theses cases, your caregiver will prescribe antifungal medicine.  For some cases of chronic sinusitis, surgery is needed. Generally, these are cases in which sinusitis recurs more than 3 times per year, despite other treatments.  HOME CARE INSTRUCTIONS   · Drink plenty of water. Water helps thin the mucus so your sinuses can drain more easily.  · Use a humidifier.  · Inhale steam 3 to 4 times a day (for example, sit in the bathroom with the shower running).  · Apply a warm, moist washcloth to your face 3 to 4 times a day,   or as directed by your caregiver.  · Use saline nasal sprays to help moisten and clean your sinuses.  · Take over-the-counter or prescription medicines for pain, discomfort, or fever only as directed by your caregiver.  SEEK IMMEDIATE MEDICAL CARE IF:  · You have increasing pain or severe headaches.  · You have nausea, vomiting, or drowsiness.  · You have swelling around your face.  · You have vision problems.  · You have a stiff neck.  · You have difficulty breathing.  MAKE SURE YOU:   · Understand these  instructions.  · Will watch your condition.  · Will get help right away if you are not doing well or get worse.  Document Released: 10/26/2005 Document Revised: 01/18/2012 Document Reviewed: 11/10/2011  ExitCare® Patient Information ©2014 [employer].  Sinus Headache  A sinus headache happens when your sinuses become clogged or puffy (swollen). Sinus headaches can be mild or severe.  HOME CARE  · Take your medicines (antibiotics) as told. Finish them even if you start to feel better.  · Only take medicine as told by your doctor.  · Use a nose spray if you feel stuffed up (congested).  GET HELP RIGHT AWAY IF:  · You have a fever.  · You have trouble seeing.  · You suddenly have pain in your face or head.  · You start to twitch or shake (seizure).  · You are confused.  · You get headaches more than once a week.  · Light or sound bothers you.  · You feel sick to your stomach (nauseous) or throw up (vomit).  · Your headaches do not get better with treatment.  MAKE SURE YOU:  · Understand these instructions.  · Will watch your condition.  · Will get help right away if you are not doing well or get worse.  Document Released: 02/25/2011 Document Revised: 01/18/2012 Document Reviewed: 02/25/2011  ExitCare® Patient Information ©2014 [employer].

## 2013-12-11 NOTE — ED Notes (Signed)
Pt. reports persistent nasal congestion / pressure with headache for several days , seen at [facility] 11/19/2013 diagnosed with sinusitis prescribed with antibiotic with no improvement .

## 2013-12-11 NOTE — ED Provider Notes (Signed)
CSN: [id number]     Arrival date & time 12/11/13  2033 History  This chart was scribed for non-physician practitioner working with Toy Baker, MD by Nicholos Johns, ED scribe. This patient was seen in room TR08C/TR08C and the patient's care was started at 10:27 PM.    Chief Complaint  Patient presents with  . Recurrent Sinusitis   The history is provided by the patient. No language interpreter was used.   HPI Comments: [name] is a 23 y.o. female who presents to the Emergency Department complaining of intermittent HA, facial pressure, swollen lymph nodes, and blurry eyes, onset 1 month ago. States HAs go from 5/10 to 8/10 when present. Pt was seen here a couple of weeks ago and diagnosed with a sinus infection and was prescribed Augmentin. Pt has been compliant with medication but states sx are not improving. Was given an IV, Benadryl, and Tylenol during her initial Ed visit. Has been using a saline wash that has provided mild relief; states symptoms always return. Has not been seen by an ENT specialist. Reports this is her first sinus infection. Pt denies any other medical problems.   Past Medical History  Diagnosis Date  . Kikuchi disease   . Sinusitis    Past Surgical History  Procedure Laterality Date  . Uvulectomy    . Lymphadenectomy     Family History  Problem Relation Age of Onset  . Diabetes Mother   . Hypertension Father   . Cancer Brother   . Heart failure Other    History  Substance Use Topics  . Smoking status: Current Every Day Smoker -- 0.50 packs/day    Types: Cigarettes  . Smokeless tobacco: Never Used  . Alcohol Use: Yes     Comment: occasinally   OB History   Grav Para Term Preterm Abortions TAB SAB Ect Mult Living                 Review of Systems  Constitutional: Negative for fever and chills.  HENT: Positive for congestion and sinus pressure. Negative for sore throat.   Eyes: Positive for visual disturbance.  Respiratory: Negative for  cough and shortness of breath.   Neurological: Positive for headaches.    Allergies  Review of patient's allergies indicates no known allergies.  Home Medications   Current Outpatient Rx  Name  Route  Sig  Dispense  Refill  . ibuprofen (ADVIL,MOTRIN) 600 MG tablet   Oral   Take 1 tablet (600 mg total) by mouth every 6 (six) hours as needed.   30 tablet   0   . naproxen sodium (ANAPROX) 220 MG tablet   Oral   Take 440 mg by mouth 2 (two) times daily as needed. Pain/fever         . Pseudoephedrine-Guaifenesin (MUCINEX D PO)   Oral   Take 2 tablets by mouth every 12 (twelve) hours as needed. Cold          Triage Vitals: BP 103/65  Pulse 112  Temp(Src) 100.2 F (37.9 C) (Oral)  Resp 18  SpO2 97% Physical Exam  Nursing note and vitals reviewed. Constitutional: She is oriented to person, place, and time. She appears well-developed and well-nourished.  HENT:  Head: Normocephalic and atraumatic.  Nose: Nose normal.  Mouth/Throat: Oropharynx is clear and moist.  Eyes: Conjunctivae and EOM are normal.  Neck: Normal range of motion. No thyromegaly present.  Cardiovascular: Normal rate and normal heart sounds.   Pulmonary/Chest: Effort normal. No  respiratory distress.  Musculoskeletal: Normal range of motion. She exhibits no tenderness.  Lymphadenopathy:    She has cervical adenopathy.  Neurological: She is oriented to person, place, and time. She has normal reflexes.  Skin: Skin is warm and dry.  Psychiatric: She has a normal mood and affect. Her behavior is normal.   ED Course  Procedures (including critical care time) DIAGNOSTIC STUDIES: Oxygen Saturation is 97% on room air, normal by my interpretation.    COORDINATION OF CARE: At 10:33 PM: Discussed treatment plan with patient which includes medication to treat symptoms and a referral to an ENT. Patient agrees.   Labs Review Labs Reviewed - No data to display Imaging Review No results found.  EKG  Interpretation   None       MDM   1. Sinusitis    Patient t completed course of augmentin.  continuing to have sxs. patien twill be given zpack, streoid to reduce swelling, follow up wit ENT> No si/sxs of meningitis.  I personally performed the services described in this documentation, which was scribed in my presence. The recorded information has been reviewed and is accurate.       Arthor CaptainAbigail Tomie Elko, PA-C 12/12/13 [phone]

## 2013-12-12 NOTE — ED Provider Notes (Signed)
Medical screening examination/treatment/procedure(s) were performed by non-physician practitioner and as supervising physician I was immediately available for consultation/collaboration.  EKG Interpretation   None        Rayola Everhart T Seab Axel, MD 12/12/13 2113 

## 2014-03-05 ENCOUNTER — Emergency Department (HOSPITAL_COMMUNITY)
Admission: EM | Admit: 2014-03-05 | Discharge: 2014-03-05 | Disposition: A | Discharge status: Home / Self Care | Payer: 59 | Attending: Emergency Medicine | Admitting: Emergency Medicine

## 2014-03-05 ENCOUNTER — Encounter (HOSPITAL_COMMUNITY): Payer: Self-pay | Admitting: Emergency Medicine

## 2014-03-05 DIAGNOSIS — IMO0002 Reserved for concepts with insufficient information to code with codable children: Secondary | ICD-10-CM | POA: Insufficient documentation

## 2014-03-05 DIAGNOSIS — Z791 Long term (current) use of non-steroidal anti-inflammatories (NSAID): Secondary | ICD-10-CM | POA: Insufficient documentation

## 2014-03-05 DIAGNOSIS — Z792 Long term (current) use of antibiotics: Secondary | ICD-10-CM | POA: Insufficient documentation

## 2014-03-05 DIAGNOSIS — R110 Nausea: Secondary | ICD-10-CM | POA: Insufficient documentation

## 2014-03-05 DIAGNOSIS — F17200 Nicotine dependence, unspecified, uncomplicated: Secondary | ICD-10-CM | POA: Insufficient documentation

## 2014-03-05 DIAGNOSIS — Z862 Personal history of diseases of the blood and blood-forming organs and certain disorders involving the immune mechanism: Secondary | ICD-10-CM | POA: Insufficient documentation

## 2014-03-05 DIAGNOSIS — R51 Headache: Secondary | ICD-10-CM | POA: Insufficient documentation

## 2014-03-05 DIAGNOSIS — Z79899 Other long term (current) drug therapy: Secondary | ICD-10-CM | POA: Insufficient documentation

## 2014-03-05 DIAGNOSIS — J3489 Other specified disorders of nose and nasal sinuses: Secondary | ICD-10-CM | POA: Insufficient documentation

## 2014-03-05 DIAGNOSIS — R519 Headache, unspecified: Secondary | ICD-10-CM

## 2014-03-05 MED ORDER — FLUTICASONE PROPIONATE 50 MCG/ACT NA SUSP: 2.0000 | Freq: Every day | NASAL | Status: DC

## 2014-03-05 NOTE — ED Provider Notes (Signed)
CSN: [id number]     Arrival date & time 03/05/14  1017 History   First MD Initiated Contact with Patient 03/05/14 1115     Chief Complaint  Patient presents with  . Headache     (Consider location/radiation/quality/duration/timing/severity/associated sxs/prior Treatment) HPI Comments: Patient is a 23 year old female with history of sinusitis who presents today with headaches. These headaches have been recurrent since January 2015. The headache is frontally located and is throbbing in nature. They are only present in the mornings. She wakes up around 6am daily and the headaches improve by 8am. She has been evaluated with head ct which showed chronic sinusitis. She has been prescribed azithromycin and augmentin without relief of her symptoms. She was seen in February and given prednisone which she feels like helped her symptoms significantly. She has associated rhinorrhea. She has not taken any decongestants or antihistamines. She denies blurry vision, double vision, ataxia, neck stiffness.   The history is provided by the patient. No language interpreter was used.    Past Medical History  Diagnosis Date  . Kikuchi disease   . Sinusitis    Past Surgical History  Procedure Laterality Date  . Uvulectomy    . Lymphadenectomy     Family History  Problem Relation Age of Onset  . Diabetes Mother   . Hypertension Father   . Cancer Brother   . Heart failure Other    History  Substance Use Topics  . Smoking status: Current Every Day Smoker -- 0.50 packs/day    Types: Cigarettes  . Smokeless tobacco: Never Used  . Alcohol Use: Yes     Comment: occasinally   OB History   Grav Para Term Preterm Abortions TAB SAB Ect Mult Living                 Review of Systems  Constitutional: Negative for fever and chills.  HENT: Positive for rhinorrhea. Negative for sore throat.   Respiratory: Negative for shortness of breath.   Cardiovascular: Negative for chest pain.  Gastrointestinal:  Positive for nausea. Negative for vomiting and abdominal pain.  Neurological: Positive for headaches.  All other systems reviewed and are negative.     Allergies  Review of patient's allergies indicates no known allergies.  Home Medications   Prior to Admission medications   Medication Sig Start Date End Date Taking? Authorizing Provider  azithromycin (ZITHROMAX Z-PAK) 250 MG tablet 2 po day one, then 1 daily x 4 days 12/11/13   Arthor CaptainAbigail Harris, PA-C  HYDROcodone-acetaminophen (NORCO) 5-325 MG per tablet Take 1-2 tablets by mouth every 4 (four) hours as needed. 12/11/13   Arthor CaptainAbigail Harris, PA-C  ibuprofen (ADVIL,MOTRIN) 600 MG tablet Take 1 tablet (600 mg total) by mouth every 6 (six) hours as needed. 11/19/13   Junius FinnerErin O'Malley, PA-C  naproxen sodium (ANAPROX) 220 MG tablet Take 440 mg by mouth 2 (two) times daily as needed. Pain/fever    Historical Provider, MD  predniSONE (DELTASONE) 20 MG tablet Take 2 tablets (40 mg total) by mouth daily. 12/11/13   Arthor CaptainAbigail Harris, PA-C  Pseudoephedrine-Guaifenesin [facility](MUCINEX D PO) Take 2 tablets by mouth every 12 (twelve) hours as needed. Cold    Historical Provider, MD   BP 126/78  Pulse 88  Temp(Src) 98 F (36.7 C) (Oral)  Resp 16  Ht 5\' 11"  (1.803 m)  Wt 219 lb 14.4 oz (99.746 kg)  BMI 30.68 kg/m2  SpO2 100% Physical Exam  Nursing note and vitals reviewed. Constitutional: She is oriented to  person, place, and time. She appears well-developed and well-nourished. No distress.  Generally quite well appearing, NAD  HENT:  Head: Normocephalic and atraumatic.  Right Ear: External ear normal. No mastoid tenderness. Tympanic membrane is retracted.  Left Ear: External ear normal. No mastoid tenderness. Tympanic membrane is retracted.  Nose: Nose normal.  Mouth/Throat: Oropharynx is clear and moist.  No temporal artery tenderness Swollen nasal turbinates bilaterally  Eyes: Conjunctivae and EOM are normal. Pupils are equal, round, and reactive to light.   Neck: Normal range of motion.  No nuchal rigidity or meningeal signs  Cardiovascular: Normal rate, regular rhythm, normal heart sounds, intact distal pulses and normal pulses.   Pulmonary/Chest: Effort normal and breath sounds normal. No stridor. No respiratory distress. She has no wheezes. She has no rales.  Abdominal: Soft. She exhibits no distension. There is no tenderness.  Musculoskeletal: Normal range of motion.  Strength 5/5 in all extremities tested against resistance.   Neurological: She is alert and oriented to person, place, and time. She has normal strength.  Finger nose finger normal. No pronator drift. Grip strength 5/5 bilaterally  Skin: Skin is warm and dry. She is not diaphoretic. No erythema.  Psychiatric: She has a normal mood and affect. Her behavior is normal.    ED Course  Procedures (including critical care time) Labs Review Labs Reviewed - No data to display  Imaging Review No results found.   EKG Interpretation   Date/Time:  Monday March 05 2014 11:26:54 EDT Ventricular Rate:  75 PR Interval:  170 QRS Duration: 88 QT Interval:  386 QTC Calculation: 431 R Axis:   31 Text Interpretation:  Sinus rhythm Normal ECG No old tracing to compare  Confirmed by [facility]  MD, Nicholos JohnsKATHLEEN [phone]([id number]) on 03/05/2014 11:58:08 AM      MDM   Final diagnoses:  Headache    Patient presents to ED for evaluation of headaches. Currently asymptomatic. Prior head CT shows chronic sinusitis. Patient continues to complain of rhinorrhea. Felt improved after prednisone and azithromycin, but symptoms recurred. I do not feel her headaches are related to increased ICP or any emergent etiology at this time. No concern for meningitis, SAH, ICH. Patient was encouraged to follow up with both a PCP and ENT physician. Patient now has insurance and can now do this. Discussed reasons to return to the ED immediately. Vital signs stable for discharge. Discussed case with Dr. Clarene DukeMcManus who agrees with  plan. Patient / Family / Caregiver informed of clinical course, understand medical decision-making process, and agree with plan.   Mora BellmanHannah S Zara Wendt, PA-C 03/05/14 1514

## 2014-03-05 NOTE — ED Notes (Signed)
Pt presents with a headache x1 month, pt states she has them every morning upon waking, pt also reports she was seen here a month ago for the same and was prescribed pain medication and prednisone and instructed to follow up with a ENT. Pt states she has not been to the ENT due to no insurance

## 2014-03-05 NOTE — Discharge Instructions (Signed)
Sinus Headache A sinus headache happens when your sinuses become clogged or puffy (swollen). Sinus headaches can be mild or severe. HOME CARE  Take your medicines (antibiotics) as told. Finish them even if you start to feel better.  Only take medicine as told by your doctor.  Use a nose spray if you feel stuffed up (congested). GET HELP RIGHT AWAY IF:  You have a fever.  You have trouble seeing.  You suddenly have pain in your face or head.  You start to twitch or shake (seizure).  You are confused.  You get headaches more than once a week.  Light or sound bothers you.  You feel sick to your stomach (nauseous) or throw up (vomit).  Your headaches do not get better with treatment. MAKE SURE YOU:  Understand these instructions.  Will watch your condition.  Will get help right away if you are not doing well or get worse. Document Released: 02/25/2011 Document Revised: 01/18/2012 Document Reviewed: 02/25/2011 [facility] Patient Information 2014 [place], [place].  Headaches, Frequently Asked Questions MIGRAINE HEADACHES Q: What is migraine? What causes it? How can I treat it? A: Generally, migraine headaches begin as a dull ache. Then they develop into a constant, throbbing, and pulsating pain. You may experience pain at the temples. You may experience pain at the front or back of one or both sides of the head. The pain is usually accompanied by a combination of:  Nausea.  Vomiting.  Sensitivity to light and noise. Some people (about 15%) experience an aura (see below) before an attack. The cause of migraine is believed to be chemical reactions in the brain. Treatment for migraine may include over-the-counter or prescription medications. It may also include self-help techniques. These include relaxation training and biofeedback.  Q: What is an aura? A: About 15% of people with migraine get an "aura". This is a sign of neurological symptoms that occur before a migraine  headache. You may see wavy or jagged lines, dots, or flashing lights. You might experience tunnel vision or blind spots in one or both eyes. The aura can include visual or auditory hallucinations (something imagined). It may include disruptions in smell (such as strange odors), taste or touch. Other symptoms include:  Numbness.  A "pins and needles" sensation.  Difficulty in recalling or speaking the correct word. These neurological events may last as long as 60 minutes. These symptoms will fade as the headache begins. Q: What is a trigger? A: Certain physical or environmental factors can lead to or "trigger" a migraine. These include:  Foods.  Hormonal changes.  Weather.  Stress. It is important to remember that triggers are different for everyone. To help prevent migraine attacks, you need to figure out which triggers affect you. Keep a headache diary. This is a good way to track triggers. The diary will help you talk to your healthcare professional about your condition. Q: Does weather affect migraines? A: Bright sunshine, hot, humid conditions, and drastic changes in barometric pressure may lead to, or "trigger," a migraine attack in some people. But studies have shown that weather does not act as a trigger for everyone with migraines. Q: What is the link between migraine and hormones? A: Hormones start and regulate many of your body's functions. Hormones keep your body in balance within a constantly changing environment. The levels of hormones in your body are unbalanced at times. Examples are during menstruation, pregnancy, or menopause. That can lead to a migraine attack. In fact, about three quarters of  all women with migraine report that their attacks are related to the menstrual cycle.  Q: Is there an increased risk of stroke for migraine sufferers? A: The likelihood of a migraine attack causing a stroke is very remote. That is not to say that migraine sufferers cannot have a stroke  associated with their migraines. In persons under age 65, the most common associated factor for stroke is migraine headache. But over the course of a person's normal life span, the occurrence of migraine headache may actually be associated with a reduced risk of dying from cerebrovascular disease due to stroke.  Q: What are acute medications for migraine? A: Acute medications are used to treat the pain of the headache after it has started. Examples over-the-counter medications, NSAIDs, ergots, and triptans.  Q: What are the triptans? A: Triptans are the newest class of abortive medications. They are specifically targeted to treat migraine. Triptans are vasoconstrictors. They moderate some chemical reactions in the brain. The triptans work on receptors in your brain. Triptans help to restore the balance of a neurotransmitter called serotonin. Fluctuations in levels of serotonin are thought to be a main cause of migraine.  Q: Are over-the-counter medications for migraine effective? A: Over-the-counter, or "OTC," medications may be effective in relieving mild to moderate pain and associated symptoms of migraine. But you should see your caregiver before beginning any treatment regimen for migraine.  Q: What are preventive medications for migraine? A: Preventive medications for migraine are sometimes referred to as "prophylactic" treatments. They are used to reduce the frequency, severity, and length of migraine attacks. Examples of preventive medications include antiepileptic medications, antidepressants, beta-blockers, calcium channel blockers, and NSAIDs (nonsteroidal anti-inflammatory drugs). Q: Why are anticonvulsants used to treat migraine? A: During the past few years, there has been an increased interest in antiepileptic drugs for the prevention of migraine. They are sometimes referred to as "anticonvulsants". Both epilepsy and migraine may be caused by similar reactions in the brain.  Q: Why are  antidepressants used to treat migraine? A: Antidepressants are typically used to treat people with depression. They may reduce migraine frequency by regulating chemical levels, such as serotonin, in the brain.  Q: What alternative therapies are used to treat migraine? A: The term "alternative therapies" is often used to describe treatments considered outside the scope of conventional Western medicine. Examples of alternative therapy include acupuncture, acupressure, and yoga. Another common alternative treatment is herbal therapy. Some herbs are believed to relieve headache pain. Always discuss alternative therapies with your caregiver before proceeding. Some herbal products contain arsenic and other toxins. TENSION HEADACHES Q: What is a tension-type headache? What causes it? How can I treat it? A: Tension-type headaches occur randomly. They are often the result of temporary stress, anxiety, fatigue, or anger. Symptoms include soreness in your temples, a tightening band-like sensation around your head (a "vice-like" ache). Symptoms can also include a pulling feeling, pressure sensations, and contracting head and neck muscles. The headache begins in your forehead, temples, or the back of your head and neck. Treatment for tension-type headache may include over-the-counter or prescription medications. Treatment may also include self-help techniques such as relaxation training and biofeedback. CLUSTER HEADACHES Q: What is a cluster headache? What causes it? How can I treat it? A: Cluster headache gets its name because the attacks come in groups. The pain arrives with little, if any, warning. It is usually on one side of the head. A tearing or bloodshot eye and a runny nose on the same  side of the headache may also accompany the pain. Cluster headaches are believed to be caused by chemical reactions in the brain. They have been described as the most severe and intense of any headache type. Treatment for cluster  headache includes prescription medication and oxygen. SINUS HEADACHES Q: What is a sinus headache? What causes it? How can I treat it? A: When a cavity in the bones of the face and skull (a sinus) becomes inflamed, the inflammation will cause localized pain. This condition is usually the result of an allergic reaction, a tumor, or an infection. If your headache is caused by a sinus blockage, such as an infection, you will probably have a fever. An x-ray will confirm a sinus blockage. Your caregiver's treatment might include antibiotics for the infection, as well as antihistamines or decongestants.  REBOUND HEADACHES Q: What is a rebound headache? What causes it? How can I treat it? A: A pattern of taking acute headache medications too often can lead to a condition known as "rebound headache." A pattern of taking too much headache medication includes taking it more than 2 days per week or in excessive amounts. That means more than the label or a caregiver advises. With rebound headaches, your medications not only stop relieving pain, they actually begin to cause headaches. Doctors treat rebound headache by tapering the medication that is being overused. Sometimes your caregiver will gradually substitute a different type of treatment or medication. Stopping may be a challenge. Regularly overusing a medication increases the potential for serious side effects. Consult a caregiver if you regularly use headache medications more than 2 days per week or more than the label advises. ADDITIONAL QUESTIONS AND ANSWERS Q: What is biofeedback? A: Biofeedback is a self-help treatment. Biofeedback uses special equipment to monitor your body's involuntary physical responses. Biofeedback monitors:  Breathing.  Pulse.  Heart rate.  Temperature.  Muscle tension.  Brain activity. Biofeedback helps you refine and perfect your relaxation exercises. You learn to control the physical responses that are related to  stress. Once the technique has been mastered, you do not need the equipment any more. Q: Are headaches hereditary? A: Four out of five (80%) of people that suffer report a family history of migraine. Scientists are not sure if this is genetic or a family predisposition. Despite the uncertainty, a child has a 50% chance of having migraine if one parent suffers. The child has a 75% chance if both parents suffer.  Q: Can children get headaches? A: By the time they reach high school, most young people have experienced some type of headache. Many safe and effective approaches or medications can prevent a headache from occurring or stop it after it has begun.  Q: What type of doctor should I see to diagnose and treat my headache? A: Start with your primary caregiver. Discuss his or her experience and approach to headaches. Discuss methods of classification, diagnosis, and treatment. Your caregiver may decide to recommend you to a headache specialist, depending upon your symptoms or other physical conditions. Having diabetes, allergies, etc., may require a more comprehensive and inclusive approach to your headache. The National Headache Foundation will provide, upon request, a list of [facility] physician members in your state. Document Released: 01/16/2004 Document Revised: 01/18/2012 Document Reviewed: 06/25/2008 [facility] Patient Information 2014 [place], [place].   Emergency Department Resource Guide 1) Find a Doctor and Pay Out of Pocket Although you won't have to find out who is covered by your insurance plan, it is a good  idea to ask around and get recommendations. You will then need to call the office and see if the doctor you have chosen will accept you as a new patient and what types of options they offer for patients who are self-pay. Some doctors offer discounts or will set up payment plans for their patients who do not have insurance, but you will need to ask so you aren't surprised when you get to your  appointment.  2) Contact Your Local Health Department Not all health departments have doctors that can see patients for sick visits, but many do, so it is worth a call to see if yours does. If you don't know where your local health department is, you can check in your phone book. The CDC also has a tool to help you locate your state's health department, and many state websites also have listings of all of their local health departments.  3) Find a Walk-in Clinic If your illness is not likely to be very severe or complicated, you may want to try a walk in clinic. These are popping up all over the country in pharmacies, drugstores, and shopping centers. They're usually staffed by nurse practitioners or physician assistants that have been trained to treat common illnesses and complaints. They're usually fairly quick and inexpensive. However, if you have serious medical issues or chronic medical problems, these are probably not your best option.  No Primary Care Doctor: - Call Health Connect at  [phone] - they can help you locate a primary care doctor that  accepts your insurance, provides certain services, etc. - Physician Referral Service- [phone]  Chronic Pain Problems: Organization         Address  Phone   Notes  [facility]  [phone] Patients need to be referred by their primary care doctor.   Medication Assistance: Organization         Address  Phone   Notes  [facility] Medication [facility] [address] [place], [place] [address] [phone]) [phone] --Must be a resident of [facility] [facility] -- Must have NO insurance coverage whatsoever (no Medicaid/ Medicare, etc.) -- The pt. MUST have a primary care doctor that directs their care regularly and follows them in the community   MedAssist  [phone]   [employer]  [phone]) [phone]    Agencies that provide inexpensive medical care: Organization         Address  Phone   Notes  [facility]  [phone]) [phone]   [facility]    [phone]   [facility] [address] [place], [place] [address] [phone]   [facility] [place] [address], [place] [phone]) [phone]   Planned Parenthood    [phone]   [facility]    [phone]   [facility] and [facility]  [address], [place] Phone:  [phone]) [phone], Fax:  [phone] Hours of Operation:  9 am - 6 pm, M-F.  Also accepts Medicaid/Medicare and self-pay.  [facility]  [address], [place] Phone: [phone], Fax: [phone]) [phone]. Hours of Operation:  8:30 am - 5:30 pm, M-F.  Also accepts Medicaid and self-pay.  [facility] [place] [address], [place] Phone: [phone]) [phone]   [facility] [address], [place], [place] [phone], Ext. [phone] Mondays & Thursdays: 7-9 AM.  First 15  patients are seen on a first come, first serve basis.    Medicaid-accepting [facility] Providers:  Organization         Address  Phone   Notes  [facility] [address], [place] [phone] Also accepts self-pay patients.  [facility] [address], [place]  [phone]   [facility] [address], [place] [phone]) [phone]   [facility] [address] [place] [address], [place] [phone]   Renaye RakersVeita Bland [address], [place]   [phone] Only accepts [place] Access [place] patients after they have their name applied to their card.   Self-Pay (no insurance) in [facility]:  Organization         Address  Phone   Notes  Sickle Cell Patients, [facility] [address], [place] [phone]) [phone]   [facility] [address], [place] [phone]   [facility]  [place]  [address], [place] [phone]   [facility]/Dr. Osei-Bonsu  [address] [place] [address], [place] or [phone], [place] [phone] Phone number for both [place] and [place] locations is the same.  [facility] [address], [place] [phone]   [facility] [place] [address] [place] [address], [place] or [address] [phone]   [facility] [address], [place] [phone], phone; [phone], fax Sees patients 1st and 3rd Saturday of every month.  Must not qualify for public or private insurance (i.e. Medicaid, Medicare, [place] Health Choice, Veterans' Benefits)  Household income should be no more than 200% of the poverty level The clinic cannot treat you if you are pregnant or think you are pregnant  Sexually transmitted diseases are not treated at the clinic.    Dental Care: Organization         Address  Phone  Notes  [facility] [address], [place] [phone] Accepts children up to age 23 who are enrolled in [place]icaid or [place] Health Choice; pregnant women with a Medicaid card; and children who have applied for Medicaid or [place] Health Choice, but were declined, whose parents can pay a reduced fee at time of service.  [facility] of [facility] [place]  [address], [place] [phone] Accepts children up to age 721 who are enrolled in [place] or [place] Health Choice; pregnant women with a Medicaid card; and children who have applied for Medicaid or [place] Health Choice, but were declined, whose parents can pay a reduced fee at time of service.  [facility]  [address], [place] [phone] Patients are seen by appointment only. Walk-ins are not accepted. [facility] will see patients 23 years of age and  older. Monday - Tuesday (8am-5pm) Most Wednesdays (8:30-5pm) $30 per visit, cash only  [facility]  [address], [facility] [phone] Patients are seen by appointment only. Walk-ins are not accepted. [facility] will see patients 23 years of age and older. One Wednesday Evening (Monthly: Volunteer Based).  $30 per visit, cash only  [employer] [facility]  [phone] for adults; Children under age 644, call Graduate Pediatric Dentistry  at [phone]. Children aged 554-14, please call [phone] to request a pediatric application.  Dental services are provided in all areas of dental care including fillings, crowns and bridges, complete and partial dentures, implants, gum treatment, root canals, and extractions. Preventive care is also provided. Treatment is provided to both adults and children. Patients are selected via a lottery and there is often a waiting list.   [facility] [address], [place]  [phone] [email or web address]   [facility] [address], [place], [place] [phone], Ext. [phone] Second and Fourth Thursday of each month, opens at 6:30 AM; Clinic ends at 9 AM.  Patients are seen on a first-come first-served basis, and a limited number are seen during each clinic.   [facility]  [address], [place], [place] [phone]   Eligibility Requirements You must have lived in [place], or [place] for at least the last three months.   You cannot be eligible for state or federal sponsored National Cityhealthcare insurance, including CIGNAVeterans Administration, [place]icaid, or [employer]Medicare.   You generally cannot be eligible for healthcare insurance through your employer.    How to apply: Eligibility screenings are held every Tuesday and Wednesday afternoon from 1:00 pm until 4:00 pm. You do not need an appointment for the interview!  [facility] [address],  [place], [place] [phone]   [address] [facility]  [phone]   [facility]  [phone]   [facility]  [phone]    Behavioral Health Resources in the Community: Intensive Outpatient Programs Organization         Address  Phone  Notes  [facility] [address], [place], [place] [phone]   [facility] [address], [place], [place] [phone]   [facility] [address], [place], [place]  [phone]   [facility] [address],  [place], [place] [phone] or [phone]   Substance Abuse Resources Organization         Address  Phone  Notes  [facility]  [phone]   [facility]  [phone]   [facility]  [phone]   [facility]  [phone]   Residential & Outpatient Substance Abuse Program  [phone]   Psychological Services Organization         Address  Phone  Notes  [facility]  [phone]   [facility]  [phone]- [phone]   [facility] [address], [place] [phone] or [phone]    Mobile Crisis Teams Organization         Address  Phone  Notes  [facility], Mobile Crisis Care Unit  [phone]   [facility]  [address] [place], [place] [phone]   Doristine LocksSharon DeEsch [address] [place] [phone]    Self-Help/Support Groups Organization         Address  Phone             Notes  [facility] [place] - variety of support groups  336- [phone] Call for more information  Narcotics Anonymous (NA), [facility] [address], [place]  2 meetings at this location   Chief Executive Officeresidential Treatment Programs Organization         Address  Phone  Notes  [facility] [address],    [place]  [phone]   [facility]  [address], [place] [phone], [place], [place] [phone]   [facility] [address], [place] [phone] Admissions: 8am-3pm M-F  [facility] [address]    [place], [place] [phone]   [facility] [address], [place], [place] [phone]   [facility] [address]  [place], [place] [phone]   [facility] Intensive Outpatient [address], [place], [place] [phone]   [facility] [address]  [place], [place] [phone] or [phone]   [facility] ([facility] [address] [place], [place] [phone] Accepts Medicaid  [facility] [address]  [place] [phone] Substance Abuse/Addiction Treatment   [facility] Organization         Address  Phone  Notes  [facility]  [phone]   Angie FavaJulie Brannon, PhD [address], [address] [place], [place]   [phone](336) [phone] or [phone]) [phone]   [facility]   [address] [place], [place] [phone]) [phone]   [facility] [address], [place], [place] [phone]) [phone] Insurance/Medicaid/sponsorship through [facility]  [facility] [address]                                    [place], [place] [phone]) [phone] Therapy/tele-psych/case  [facility] [address]   [place], [place] [phone]) [phone]    Dr. Lolly MustacheArfeen  [phone](336) [phone]   [facility]  [employer] [facility] 1) [address], [place] 2) [address], [place] 3)  [address], [place] [phone]) [phone]  [phone](336) [phone]   [facility] Child Abuse Hotline [phone]) [phone] or [phone](336) [phone] (After Hours)

## 2014-03-05 NOTE — ED Notes (Signed)
Patient endorses intermittent headaches in the morning when first waking up daily x 1 month. Sts pain in anterior head. Denies blurry vision, nausea, vomiting, lightheadedness, dizziness. Sts medications received in ED for prior visit helped but pain has come back worse. Patient denies pain at present time. No facial droop, arm or leg drift, no CP or SOB. Patients sts now has insurance and has PCP appt for May 1st.

## 2014-03-07 NOTE — ED Provider Notes (Signed)
Medical screening examination/treatment/procedure(s) were performed by non-physician practitioner and as supervising physician I was immediately available for consultation/collaboration.   EKG Interpretation   Date/Time:  Monday March 05 2014 11:26:54 EDT Ventricular Rate:  75 PR Interval:  170 QRS Duration: 88 QT Interval:  386 QTC Calculation: 431 R Axis:   31 Text Interpretation:  Sinus rhythm Normal ECG No old tracing to compare  Confirmed by [facility]  MD, Nicholos JohnsKATHLEEN [phone]([id number]) on 03/05/2014 11:58:08 AM        Tina AngerKathleen M Marigene Erler, DO 03/07/14 1646

## 2014-03-09 ENCOUNTER — Ambulatory Visit: Payer: Self-pay | Admitting: Family Medicine

## 2014-07-28 ENCOUNTER — Encounter (HOSPITAL_COMMUNITY): Payer: Self-pay | Admitting: Emergency Medicine

## 2014-07-28 ENCOUNTER — Emergency Department (HOSPITAL_COMMUNITY)
Admission: EM | Admit: 2014-07-28 | Discharge: 2014-07-28 | Disposition: A | Discharge status: Home / Self Care | Payer: 59 | Attending: Emergency Medicine | Admitting: Emergency Medicine

## 2014-07-28 DIAGNOSIS — J3489 Other specified disorders of nose and nasal sinuses: Secondary | ICD-10-CM | POA: Insufficient documentation

## 2014-07-28 DIAGNOSIS — IMO0002 Reserved for concepts with insufficient information to code with codable children: Secondary | ICD-10-CM | POA: Insufficient documentation

## 2014-07-28 DIAGNOSIS — H9209 Otalgia, unspecified ear: Secondary | ICD-10-CM | POA: Insufficient documentation

## 2014-07-28 DIAGNOSIS — Z862 Personal history of diseases of the blood and blood-forming organs and certain disorders involving the immune mechanism: Secondary | ICD-10-CM | POA: Insufficient documentation

## 2014-07-28 DIAGNOSIS — R55 Syncope and collapse: Secondary | ICD-10-CM | POA: Insufficient documentation

## 2014-07-28 DIAGNOSIS — J019 Acute sinusitis, unspecified: Secondary | ICD-10-CM | POA: Insufficient documentation

## 2014-07-28 DIAGNOSIS — F17200 Nicotine dependence, unspecified, uncomplicated: Secondary | ICD-10-CM | POA: Insufficient documentation

## 2014-07-28 DIAGNOSIS — Z3202 Encounter for pregnancy test, result negative: Secondary | ICD-10-CM | POA: Insufficient documentation

## 2014-07-28 DIAGNOSIS — R011 Cardiac murmur, unspecified: Secondary | ICD-10-CM | POA: Insufficient documentation

## 2014-07-28 LAB — URINALYSIS, ROUTINE W REFLEX MICROSCOPIC
Bilirubin Urine: NEGATIVE
GLUCOSE, UA: NEGATIVE mg/dL
Hgb urine dipstick: NEGATIVE
KETONES UR: NEGATIVE mg/dL
LEUKOCYTES UA: NEGATIVE
Nitrite: NEGATIVE
PH: 6 (ref 5.0–8.0)
Protein, ur: NEGATIVE mg/dL
SPECIFIC GRAVITY, URINE: 1.031 — AB (ref 1.005–1.030)
Urobilinogen, UA: 1 mg/dL (ref 0.0–1.0)

## 2014-07-28 LAB — POC URINE PREG, ED: PREG TEST UR: NEGATIVE

## 2014-07-28 MED ORDER — AMOXICILLIN-POT CLAVULANATE 875-125 MG PO TABS: 1.0000 | ORAL_TABLET | Freq: Two times a day (BID) | ORAL | Status: DC

## 2014-07-28 MED ORDER — OXYMETAZOLINE HCL 0.05 % NA SOLN: 1.0000 | Freq: Two times a day (BID) | NASAL | Status: DC

## 2014-07-28 NOTE — ED Provider Notes (Signed)
Medical screening examination/treatment/procedure(s) were performed by non-physician practitioner and as supervising physician I was immediately available for consultation/collaboration.   EKG Interpretation   Date/Time:  Saturday July 28 2014 07:02:09 EDT Ventricular Rate:  82 PR Interval:  162 QRS Duration: 84 QT Interval:  349 QTC Calculation: 407 R Axis:   33 Text Interpretation:  Sinus rhythm Normal ECG No significant change since  last tracing Confirmed by [facility]  MD, Jonny Ruiz ([id number]) on 07/28/2014 7:09:38 AM       Derwood Kaplan, MD 07/28/14 2317

## 2014-07-28 NOTE — ED Notes (Signed)
Pt reports she has been experiencing nasal and sinus congestion and a runny nose for about a week now with increased pain to her sinus areas. Denies cough, N/V/D/fever.

## 2014-07-28 NOTE — ED Provider Notes (Signed)
CSN: [id number]     Arrival date & time 07/28/14  9562 History   First MD Initiated Contact with Patient 07/28/14 0602     Chief Complaint  Patient presents with  . Nasal Congestion     (Consider location/radiation/quality/duration/timing/severity/associated sxs/prior Treatment) HPI Comments: Patient presents with complaint of nasal congestion, sinus pressure for approximately one week. Patient has a history of frequent sinus infections. She denies having fevers, nausea, vomiting, diarrhea. She does not have a significant cough. Patient states that she works and does a lot of standing on her feet. Patient endorses 2 syncopal episodes over the past week that she attributes to "feeling bad" while at work. No strenuous activity during these episodes. Patient states that she "fell out" briefly and had to be helped into a chair or her coworkers. She states that she has not been eating and drinking as well as normal. No family history of sudden cardiac death at young age. Patient denies chest pains or abdominal pain. No dysuria or hematuria. Patient denies heavy bleeding including heavy menstrual periods. No history of anemia. Patient states that she has been using Sudafed at home without relief. The onset of this condition was acute. The course is constant. Aggravating factors: none. Alleviating factors: none.    The history is provided by the patient.    Past Medical History  Diagnosis Date  . Kikuchi disease   . Sinusitis    Past Surgical History  Procedure Laterality Date  . Uvulectomy    . Lymphadenectomy     Family History  Problem Relation Age of Onset  . Diabetes Mother   . Hypertension Father   . Cancer Brother   . Heart failure Other    History  Substance Use Topics  . Smoking status: Current Every Day Smoker -- 0.50 packs/day    Types: Cigarettes  . Smokeless tobacco: Never Used  . Alcohol Use: Yes     Comment: occasinally   OB History   Grav Para Term Preterm Abortions  TAB SAB Ect Mult Living                 Review of Systems  Constitutional: Negative for fever.  HENT: Positive for congestion, ear pain, rhinorrhea and sinus pressure. Negative for facial swelling and sore throat.   Eyes: Negative for redness.  Respiratory: Negative for cough.   Cardiovascular: Negative for chest pain.  Gastrointestinal: Negative for nausea, vomiting, abdominal pain and diarrhea.  Genitourinary: Negative for dysuria.  Musculoskeletal: Negative for myalgias.  Skin: Negative for rash.  Neurological: Positive for syncope. Negative for headaches.    Allergies  Review of patient's allergies indicates no known allergies.  Home Medications   Prior to Admission medications   Medication Sig Start Date End Date Taking? Authorizing Provider  fluticasone (FLONASE) 50 MCG/ACT nasal spray Place 2 sprays into both nostrils daily. 03/05/14   Mora Bellman, PA-C   BP 133/72  Pulse 89  Temp(Src) 98.9 F (37.2 C) (Oral)  Resp 20  Ht  (1.753 m)  SpO2 100%  LMP 06/28/2014  Physical Exam  Nursing note and vitals reviewed. Constitutional: She appears well-developed and well-nourished.  HENT:  Head: Normocephalic and atraumatic.  Right Ear: Tympanic membrane, external ear and ear canal normal.  Left Ear: Tympanic membrane, external ear and ear canal normal.  Nose: Mucosal edema present. No rhinorrhea. Right sinus exhibits maxillary sinus tenderness and frontal sinus tenderness. Left sinus exhibits maxillary sinus tenderness and frontal sinus tenderness.  Mouth/Throat: Uvula  is midline, oropharynx is clear and moist and mucous membranes are normal. Mucous membranes are not dry. No oral lesions. No trismus in the jaw. No uvula swelling. No oropharyngeal exudate, posterior oropharyngeal edema, posterior oropharyngeal erythema or tonsillar abscesses.  Eyes: Conjunctivae are normal. Right eye exhibits no discharge. Left eye exhibits no discharge.  No conjunctival pallor   Neck: Normal range of motion. Neck supple.  Cardiovascular: Normal rate and regular rhythm.   Murmur (I/VI LSB) heard. Pulmonary/Chest: Effort normal and breath sounds normal. No respiratory distress. She has no wheezes. She has no rales.  Abdominal: Soft. There is no tenderness.  Lymphadenopathy:    She has no cervical adenopathy.  Neurological: She is alert.  Skin: Skin is warm and dry.  Psychiatric: She has a normal mood and affect.    ED Course  Procedures (including critical care time) Labs Review Labs Reviewed  URINALYSIS, ROUTINE W REFLEX MICROSCOPIC - Abnormal; Notable for the following:    Specific Gravity, Urine 1.031 (*)    All other components within normal limits  POC URINE PREG, ED    Imaging Review No results found.   EKG Interpretation   Date/Time:  Saturday July 28 2014 07:02:09 EDT Ventricular Rate:  82 PR Interval:  162 QRS Duration: 84 QT Interval:  349 QTC Calculation: 407 R Axis:   33 Text Interpretation:  Sinus rhythm Normal ECG No significant change since  last tracing Confirmed by [facility]  MD, Jonny Ruiz ([id number]) on 07/28/2014 7:09:38 AM      6:28 AM Patient seen and examined. Work-up initiated -- will do syncope work-up given her history. No concerning features to her story. She describes feeling bad due to sinus infection, standing a lot at work, working in a warm environment, not eating and drinking well. Syncope likely 2/2 to these factors.    Vital signs reviewed and are as follows: BP 133/72  Pulse 89  Temp(Src) 98.9 F (37.2 C) (Oral)  Resp 20  Ht  (1.753 m)  SpO2 100%  LMP 06/28/2014  9:14 AM Work-up unremarkable.   Pt to continue symptomatic care and add Afrin for 3 days. If she continues to have symptoms in 3 days, she has rx Augmentin to fill.   Patient urged to return with worsening symptoms or other concerns. Patient verbalized understanding and agrees with plan. PCP referral given.    MDM   Final diagnoses:  Acute  sinusitis, recurrence not specified, unspecified location  Syncope, unspecified syncope type   Sinusitis: treatment as above, pt non-toxic.   Syncope: EKG normal, UPT neg, neg orthostatics, exam not suggestive of anemia, story is not suggestive of cardiogenic cause of syncope. [place] suggestive of mild dehydration. Feel patient is safe for d/c to home.   No dangerous or life-threatening conditions suspected or identified by history, physical exam, and by work-up. No indications for hospitalization identified.     Renne Crigler, PA-C 07/28/14 [phone]

## 2014-07-28 NOTE — Discharge Instructions (Signed)
Please read and follow all provided instructions.  Your diagnoses today include:  1. Acute sinusitis, recurrence not specified, unspecified location   2. Syncope, unspecified syncope type     Tests performed today include:  Vital signs. See below for your results today.   Medications prescribed:   Oxymetazoline - nasal spray for congestion. Do not use for more than 3 days in a row because this medicine can cause rebound congestion.    Augmentin - antibiotic  I have prescribed this so that you can start taking in 3 days if you are not feeling better. Please continue symptomatic treatment with Flonase, Afrin, Sudafed. Do not start taking this medication before 08/01/2014.   You have been prescribed an antibiotic medicine: take the entire course of medicine even if you are feeling better. Stopping early can cause the antibiotic not to work.       Take any prescribed medications only as directed. Treatment for your infection is aimed at treating the symptoms. There are no medications, such as antibiotics, that will cure your infection.   Home care instructions:  Follow any educational materials contained in this packet.   Your illness is contagious and can be spread to others, especially during the first 3 or 4 days.Take basic precautions such as washing your hands often, covering your mouth when you cough or sneeze, and avoiding public places where you could spread your illness to others.   Please continue drinking plenty of fluids.  Use over-the-counter medicines as needed as directed on packaging for symptom relief.  You may also use ibuprofen or tylenol as directed on packaging for pain or fever.  Do not take multiple medicines containing Tylenol or acetaminophen to avoid taking too much of this medication.  Follow-up instructions: Please follow-up with your primary care provider in the next 3 days for further evaluation of your symptoms if you are not feeling better.   Return  instructions:   Please return to the Emergency Department if you experience worsening symptoms.   RETURN IMMEDIATELY IF you develop shortness of breath, confusion or altered mental status, a new rash, become dizzy, faint, or poorly responsive, or are unable to be cared for at home.  Please return if you have persistent vomiting and cannot keep down fluids or develop a fever that is not controlled by tylenol or motrin.    Please return if you have any other emergent concerns.  Additional Information:  Your vital signs today were: BP 117/73   Pulse 79   Temp(Src) 98.9 F (37.2 C) (Oral)   Resp 13   Ht  (1.753 m)   SpO2 99%   LMP 06/28/2014 If your blood pressure (BP) was elevated above 135/85 this visit, please have this repeated by your doctor within one month. --------------

## 2015-04-23 ENCOUNTER — Emergency Department (HOSPITAL_COMMUNITY)
Admission: EM | Admit: 2015-04-23 | Discharge: 2015-04-23 | Disposition: A | Discharge status: Home / Self Care | Payer: 59 | Attending: Emergency Medicine | Admitting: Emergency Medicine

## 2015-04-23 DIAGNOSIS — Z8709 Personal history of other diseases of the respiratory system: Secondary | ICD-10-CM | POA: Insufficient documentation

## 2015-04-23 DIAGNOSIS — Z720 Tobacco use: Secondary | ICD-10-CM | POA: Insufficient documentation

## 2015-04-23 DIAGNOSIS — H109 Unspecified conjunctivitis: Secondary | ICD-10-CM

## 2015-04-23 DIAGNOSIS — Z8679 Personal history of other diseases of the circulatory system: Secondary | ICD-10-CM | POA: Insufficient documentation

## 2015-04-23 DIAGNOSIS — Z79899 Other long term (current) drug therapy: Secondary | ICD-10-CM | POA: Insufficient documentation

## 2015-04-23 MED ORDER — ERYTHROMYCIN 5 MG/GM OP OINT: 1.0000 "application " | TOPICAL_OINTMENT | Freq: Every day | OPHTHALMIC | Status: DC

## 2015-04-23 MED ORDER — FLUORESCEIN SODIUM 1 MG OP STRP
1.0000 | ORAL_STRIP | Freq: Once | OPHTHALMIC | Status: AC
  Administered 2015-04-23: 1 via OPHTHALMIC
  Filled 2015-04-23: qty 1

## 2015-04-23 NOTE — ED Notes (Signed)
Pt states that since yesterday she has felt like someone has been scratching her R eye. Tried flushing it but is still having pain and sensitive to light. Alert and oriented.

## 2015-04-23 NOTE — ED Provider Notes (Signed)
CSN: [id number]     Arrival date & time 04/23/15  1533 History  This chart was scribed for non-physician practitioner, Catha Gosselin, PA-C working with Linwood Dibbles, MD, by Abel Presto, ED Scribe. This patient was seen in room WTR9/WTR9 and the patient's care was started at 3:49 PM.      Chief Complaint  Patient presents with  . Eye Pain     HPI HPI Comments: [name] is a 24 y.o. female who presents to the Emergency Department complaining of 6-7/10 right eye pain with onset yesterday. She states yesterday she felt as though there was a foreign body in her eye and flushed it multiple times with no relief. Pt notes associated redness, photophobia and states she noticed a "white haze" disturbing vision yesterday but that has resolved. Pt took a Tylenol for a headache yesterday but has not taken any medication today. She tried eye drops which she reports caused a burning pain. She denies recent sick contacts. She denies any significant discharge, cough, congestion, rhinorrhea, sore throat, and ear pain.   Past Medical History  Diagnosis Date  . Kikuchi disease   . Sinusitis    Past Surgical History  Procedure Laterality Date  . Uvulectomy    . Lymphadenectomy     Family History  Problem Relation Age of Onset  . Diabetes Mother   . Hypertension Father   . Cancer Brother   . Heart failure Other    History  Substance Use Topics  . Smoking status: Current Every Day Smoker -- 0.50 packs/day    Types: Cigarettes  . Smokeless tobacco: Never Used  . Alcohol Use: Yes     Comment: occasinally   OB History    No data available     Review of Systems  HENT: Negative for congestion, ear pain, rhinorrhea and sore throat.   Eyes: Positive for photophobia, pain, redness and visual disturbance. Negative for discharge.  Respiratory: Negative for cough.       Allergies  Review of patient's allergies indicates no known allergies.  Home Medications   Prior to Admission  medications   Medication Sig Start Date End Date Taking? Authorizing Provider  amoxicillin-clavulanate (AUGMENTIN) 875-125 MG per tablet Take 1 tablet by mouth every 12 (twelve) hours. 07/28/14   Renne Crigler, PA-C  erythromycin [facility]) ophthalmic ointment Place 1 application into the right eye at bedtime. 04/23/15   Reed Dady Patel-Mills, PA-C  fluticasone (FLONASE) 50 MCG/ACT nasal spray Place 2 sprays into both nostrils daily as needed for allergies or rhinitis.    Historical Provider, MD  oxymetazoline (AFRIN NASAL SPRAY) 0.05 % nasal spray Place 1 spray into both nostrils 2 (two) times daily. Do not use more than 3 consecutive days. 07/28/14   Renne Crigler, PA-C  Phenylephrine-Ibuprofen (ADVIL CONGESTION RELIEF) 10-200 MG TABS Take 1 tablet by mouth once.    Historical Provider, MD  pseudoephedrine (SUDAFED) 30 MG tablet Take 30 mg by mouth every 4 (four) hours as needed for congestion.    Historical Provider, MD   BP 115/80 mmHg  Pulse 84  Temp(Src) 98.2 F (36.8 C) (Oral)  Resp 16  SpO2 100%  LMP 04/17/2015 Physical Exam  Constitutional: She is oriented to person, place, and time. She appears well-developed and well-nourished.  HENT:  Head: Normocephalic and atraumatic.  I used the fluorescein stain and was able to see the right eye under the [employer]. There was no fluorescein uptake and no corneal abrasion. She has no conjunctival erythema. Patient is  sensitive to light and her right eye is tearing.  Eyes: Conjunctivae and EOM are normal. Pupils are equal, round, and reactive to light. Lids are everted and swept, no foreign bodies found. Right eye exhibits discharge. Right eye exhibits no hordeolum. No foreign body present in the right eye. Left eye exhibits no discharge and no hordeolum. No foreign body present in the left eye. Right conjunctiva is not injected. Right conjunctiva has no hemorrhage. Left conjunctiva is not injected. Left conjunctiva has no hemorrhage. No scleral icterus.  Right eye exhibits normal extraocular motion. Left eye exhibits normal extraocular motion.  Neck: Normal range of motion.  Musculoskeletal: Normal range of motion.  Neurological: She is alert and oriented to person, place, and time.  Skin: Skin is warm and dry.    ED Course  Procedures (including critical care time) DIAGNOSTIC STUDIES: Oxygen Saturation is 100% on room air, normal by my interpretation.    COORDINATION OF CARE: 3:54 PM Discussed treatment plan with patient at beside, the patient agrees with the plan and has no further questions at this time.    Labs Review Labs Reviewed - No data to display  Imaging Review No results found.   EKG Interpretation None      MDM   Final diagnoses:  Conjunctivitis, right eye   Patient presents for right eye pain that began yesterday. She states she has photophobia. She states she had a small amount of matting when she woke up this morning with burning of the right eye. Wood's lamp exam was normal. I prescribed the patient optic erythromycin and treated her for conjunctivitis. I gave patient optometry follow-up and she verbally agrees with the plan.  I personally performed the services described in this documentation, which was scribed in my presence. The recorded information has been reviewed and is accurate.     Catha Gosselin, PA-C 04/23/15 1642  Linwood Dibbles, MD 04/25/15 [phone]

## 2015-04-23 NOTE — Discharge Instructions (Signed)

## 2016-12-07 ENCOUNTER — Other Ambulatory Visit (HOSPITAL_COMMUNITY)
Admission: RE | Admit: 2016-12-07 | Discharge: 2016-12-07 | Disposition: A | Discharge status: Home / Self Care | Payer: BLUE CROSS/BLUE SHIELD | Source: Ambulatory Visit | Attending: Family | Admitting: Family

## 2016-12-07 ENCOUNTER — Other Ambulatory Visit: Payer: Self-pay

## 2016-12-07 DIAGNOSIS — Z23 Encounter for immunization: Secondary | ICD-10-CM | POA: Diagnosis not present

## 2016-12-07 DIAGNOSIS — Z01411 Encounter for gynecological examination (general) (routine) with abnormal findings: Secondary | ICD-10-CM | POA: Insufficient documentation

## 2016-12-07 DIAGNOSIS — Z113 Encounter for screening for infections with a predominantly sexual mode of transmission: Secondary | ICD-10-CM | POA: Diagnosis not present

## 2016-12-07 DIAGNOSIS — Z0001 Encounter for general adult medical examination with abnormal findings: Secondary | ICD-10-CM | POA: Diagnosis not present

## 2016-12-07 DIAGNOSIS — Z01419 Encounter for gynecological examination (general) (routine) without abnormal findings: Secondary | ICD-10-CM | POA: Diagnosis not present

## 2016-12-10 DIAGNOSIS — N970 Female infertility associated with anovulation: Secondary | ICD-10-CM | POA: Diagnosis not present

## 2016-12-10 DIAGNOSIS — E669 Obesity, unspecified: Secondary | ICD-10-CM | POA: Diagnosis not present

## 2016-12-11 LAB — CYTOLOGY - PAP
CHLAMYDIA, DNA PROBE: NEGATIVE
NEISSERIA GONORRHEA: NEGATIVE
Trichomonas: NEGATIVE

## 2017-01-04 ENCOUNTER — Encounter (HOSPITAL_COMMUNITY): Payer: Self-pay | Admitting: Family Medicine

## 2017-01-04 ENCOUNTER — Ambulatory Visit (HOSPITAL_COMMUNITY)
Admission: EM | Admit: 2017-01-04 | Discharge: 2017-01-04 | Disposition: A | Discharge status: Home / Self Care | Payer: BLUE CROSS/BLUE SHIELD | Attending: Family Medicine | Admitting: Family Medicine

## 2017-01-04 DIAGNOSIS — R102 Pelvic and perineal pain: Secondary | ICD-10-CM | POA: Diagnosis not present

## 2017-01-04 LAB — POCT URINALYSIS DIP (DEVICE)
Bilirubin Urine: NEGATIVE
Glucose, UA: NEGATIVE mg/dL
Hgb urine dipstick: NEGATIVE
KETONES UR: NEGATIVE mg/dL
Nitrite: NEGATIVE
Protein, ur: NEGATIVE mg/dL
SPECIFIC GRAVITY, URINE: 1.025 (ref 1.005–1.030)
Urobilinogen, UA: 0.2 mg/dL (ref 0.0–1.0)
pH: 6.5 (ref 5.0–8.0)

## 2017-01-04 LAB — POCT PREGNANCY, URINE: Preg Test, Ur: NEGATIVE

## 2017-01-04 NOTE — ED Triage Notes (Signed)
Pt here for lower abd pain. sts she was seen at the end of January and had pap smear and told possible PCOS and abnormal cells on cervix. sts the pain has been since January but really bad today. Denise vaginal discharge and urinary symptoms.

## 2017-01-04 NOTE — Discharge Instructions (Signed)
Ibuprofen as needed, heat, continue birth control, see your doctor if needed

## 2017-01-04 NOTE — ED Provider Notes (Signed)
[facility]    CSN: [id number] Arrival date & time: 01/04/17  1333     History   Chief Complaint Chief Complaint  Patient presents with  . Abdominal Pain    HPI [name] is a 26 y.o. female.   The history is provided by the patient.  Abdominal Pain  Pain location:  Suprapubic Pain quality: sharp   Pain severity:  Moderate Onset quality:  Gradual Progression:  Worsening Chronicity:  Chronic (seen by lmd and dx with pcos and started on bcp and sx worse today. no bleeding) Context: not sick contacts   Relieved by:  None tried Worsened by:  Nothing Ineffective treatments:  None tried Associated symptoms: no constipation, no diarrhea, no nausea, no vaginal bleeding and no vomiting     Past Medical History:  Diagnosis Date  . Kikuchi disease   . Sinusitis     There are no active problems to display for this patient.   Past Surgical History:  Procedure Laterality Date  . LYMPHADENECTOMY    . UVULECTOMY      OB History    No data available       Home Medications    Prior to Admission medications   Not on File    Family History Family History  Problem Relation Age of Onset  . Diabetes Mother   . Hypertension Father   . Cancer Brother   . Heart failure Other     Social History Social History  Substance Use Topics  . Smoking status: Current Every Day Smoker    Packs/day: 0.50    Types: Cigarettes  . Smokeless tobacco: Never Used  . Alcohol use Yes     Comment: occasinally     Allergies   Patient has no known allergies.   Review of Systems Review of Systems  Constitutional: Negative.   Gastrointestinal: Positive for abdominal pain. Negative for anal bleeding, blood in stool, constipation, diarrhea, nausea and vomiting.  Genitourinary: Negative.  Negative for vaginal bleeding.  All other systems reviewed and are negative.    Physical Exam Triage Vital Signs ED Triage Vitals  Enc Vitals Group     BP 01/04/17 1403  115/73     Pulse Rate 01/04/17 1403 77     Resp 01/04/17 1403 18     Temp 01/04/17 1403 98.4 F (36.9 C)     Temp src --      SpO2 01/04/17 1403 100 %     Weight --      Height --      Head Circumference --      Peak Flow --      Pain Score 01/04/17 1404 6     Pain Loc --      Pain Edu? --      Excl. in GC? --    No data found.   Updated Vital Signs BP 115/73   Pulse 77   Temp 98.4 F (36.9 C)   Resp 18   LMP 11/03/2016   SpO2 100%   Visual Acuity Right Eye Distance:   Left Eye Distance:   Bilateral Distance:    Right Eye Near:   Left Eye Near:    Bilateral Near:     Physical Exam  Constitutional: She appears well-developed and well-nourished. She appears distressed.  Abdominal: Soft. Bowel sounds are normal. She exhibits no distension and no mass. There is tenderness. There is no rebound and no guarding.  Musculoskeletal: Normal range of motion.  Nursing  note and vitals reviewed.    UC Treatments / Results  Labs (all labs ordered are listed, but only abnormal results are displayed) Labs Reviewed  POCT URINALYSIS DIP (DEVICE) - Abnormal; Notable for the following:       Result Value   Leukocytes, UA TRACE (*)    All other components within normal limits  POCT PREGNANCY, URINE    EKG  EKG Interpretation None       Radiology No results found.  Procedures Procedures (including critical care time)  Medications Ordered in UC Medications - No data to display   Initial Impression / Assessment and Plan / UC Course  I have reviewed the triage vital signs and the nursing notes.  Pertinent labs & imaging results that were available during my care of the patient were reviewed by me and considered in my medical decision making (see chart for details).       Final Clinical Impressions(s) / UC Diagnoses   Final diagnoses:  Pelvic pain in female    New Prescriptions New Prescriptions   No medications on file     Linna Hoff, MD 01/04/17  [phone]

## 2017-01-10 ENCOUNTER — Encounter (HOSPITAL_COMMUNITY): Payer: Self-pay

## 2017-01-10 ENCOUNTER — Emergency Department (HOSPITAL_COMMUNITY)
Admission: EM | Admit: 2017-01-10 | Discharge: 2017-01-10 | Disposition: A | Discharge status: Home / Self Care | Payer: BLUE CROSS/BLUE SHIELD | Attending: Emergency Medicine | Admitting: Emergency Medicine

## 2017-01-10 ENCOUNTER — Emergency Department (HOSPITAL_COMMUNITY): Payer: BLUE CROSS/BLUE SHIELD

## 2017-01-10 DIAGNOSIS — R102 Pelvic and perineal pain: Secondary | ICD-10-CM

## 2017-01-10 DIAGNOSIS — N730 Acute parametritis and pelvic cellulitis: Secondary | ICD-10-CM | POA: Diagnosis not present

## 2017-01-10 DIAGNOSIS — R938 Abnormal findings on diagnostic imaging of other specified body structures: Secondary | ICD-10-CM | POA: Diagnosis not present

## 2017-01-10 DIAGNOSIS — F17210 Nicotine dependence, cigarettes, uncomplicated: Secondary | ICD-10-CM | POA: Diagnosis not present

## 2017-01-10 DIAGNOSIS — N739 Female pelvic inflammatory disease, unspecified: Secondary | ICD-10-CM | POA: Diagnosis not present

## 2017-01-10 LAB — COMPREHENSIVE METABOLIC PANEL
ALK PHOS: 29 U/L — AB (ref 38–126)
ALT: 22 U/L (ref 14–54)
ANION GAP: 7 (ref 5–15)
AST: 20 U/L (ref 15–41)
Albumin: 3.7 g/dL (ref 3.5–5.0)
BILIRUBIN TOTAL: 0.3 mg/dL (ref 0.3–1.2)
BUN: 6 mg/dL (ref 6–20)
CALCIUM: 8.7 mg/dL — AB (ref 8.9–10.3)
CO2: 24 mmol/L (ref 22–32)
Chloride: 107 mmol/L (ref 101–111)
Creatinine, Ser: 0.66 mg/dL (ref 0.44–1.00)
GLUCOSE: 83 mg/dL (ref 65–99)
Potassium: 3.6 mmol/L (ref 3.5–5.1)
Sodium: 138 mmol/L (ref 135–145)
Total Protein: 8.4 g/dL — ABNORMAL HIGH (ref 6.5–8.1)

## 2017-01-10 LAB — URINALYSIS, ROUTINE W REFLEX MICROSCOPIC
Bacteria, UA: NONE SEEN
Bilirubin Urine: NEGATIVE
GLUCOSE, UA: NEGATIVE mg/dL
Ketones, ur: NEGATIVE mg/dL
Leukocytes, UA: NEGATIVE
NITRITE: NEGATIVE
PROTEIN: NEGATIVE mg/dL
Specific Gravity, Urine: 1.023 (ref 1.005–1.030)
Squamous Epithelial / LPF: NONE SEEN
pH: 6 (ref 5.0–8.0)

## 2017-01-10 LAB — CBC
HCT: 36.2 % (ref 36.0–46.0)
HEMOGLOBIN: 11.7 g/dL — AB (ref 12.0–15.0)
MCH: 24.2 pg — ABNORMAL LOW (ref 26.0–34.0)
MCHC: 32.3 g/dL (ref 30.0–36.0)
MCV: 74.9 fL — ABNORMAL LOW (ref 78.0–100.0)
Platelets: 255 10*3/uL (ref 150–400)
RBC: 4.83 MIL/uL (ref 3.87–5.11)
RDW: 16.9 % — ABNORMAL HIGH (ref 11.5–15.5)
WBC: 7 10*3/uL (ref 4.0–10.5)

## 2017-01-10 LAB — WET PREP, GENITAL
Clue Cells Wet Prep HPF POC: NONE SEEN
Sperm: NONE SEEN
Trich, Wet Prep: NONE SEEN
Yeast Wet Prep HPF POC: NONE SEEN

## 2017-01-10 LAB — LIPASE, BLOOD: Lipase: 19 U/L (ref 11–51)

## 2017-01-10 LAB — I-STAT BETA HCG BLOOD, ED (MC, WL, AP ONLY): I-stat hCG, quantitative: <5 m[IU]/mL (ref <5)

## 2017-01-10 MED ORDER — DEXTROSE 5 % IV SOLN
1.0000 g | Freq: Once | INTRAVENOUS | Status: AC
  Administered 2017-01-10: 1 g via INTRAVENOUS
  Filled 2017-01-10: qty 10

## 2017-01-10 MED ORDER — DOXYCYCLINE HYCLATE 100 MG PO CAPS: 100.0000 mg | ORAL_CAPSULE | Freq: Two times a day (BID) | ORAL | 0 refills | Status: DC

## 2017-01-10 MED ORDER — AZITHROMYCIN 250 MG PO TABS
1000.0000 mg | ORAL_TABLET | Freq: Once | ORAL | Status: DC
  Filled 2017-01-10: qty 4

## 2017-01-10 MED ORDER — CEFTRIAXONE SODIUM 250 MG IJ SOLR
250.0000 mg | Freq: Once | INTRAMUSCULAR | Status: DC
  Filled 2017-01-10: qty 250

## 2017-01-10 MED ORDER — STERILE WATER FOR INJECTION IJ SOLN
INTRAMUSCULAR | Status: AC
  Filled 2017-01-10: qty 10

## 2017-01-10 NOTE — Discharge Instructions (Signed)
Return here as needed.  Follow-up with your primary doctor. °

## 2017-01-10 NOTE — ED Notes (Signed)
Pelvic cart is set up at bedside.  

## 2017-01-10 NOTE — ED Provider Notes (Signed)
[facility] Provider Note   CSN: [id number] Arrival date & time: 01/10/17  [id number]     History   Chief Complaint Chief Complaint  Patient presents with  . Abdominal Pain    HPI [name] is a 26 y.o. female.  HPI Patient presents to the emergency department with pelvic pain for the last week.  The patient states that she also has some discharge, states she started her period 2 days ago.  The patient states nothing seems make the condition better or worse.  She states that she did not take any medications prior to arrival. The patient denies chest pain, shortness of breath, headache,blurred vision, neck pain, fever, cough, weakness, numbness, dizziness, anorexia, edema,  nausea, vomiting, diarrhea, rash, back pain, dysuria, hematemesis, bloody stool, near syncope, or syncope. Past Medical History:  Diagnosis Date  . Kikuchi disease   . Sinusitis     There are no active problems to display for this patient.   Past Surgical History:  Procedure Laterality Date  . LYMPHADENECTOMY    . UVULECTOMY      OB History    No data available       Home Medications    Prior to Admission medications   Medication Sig Start Date End Date Taking? Authorizing Provider  ibuprofen (ADVIL,MOTRIN) 200 MG tablet Take 200 mg by mouth every 6 (six) hours as needed for cramping.   Yes Historical Provider, MD  Pseudoeph-Doxylamine-DM-APAP (TYLENOL COLD/FLU SEVERE NIGHT PO) Take 15 mLs by mouth at bedtime as needed (for cold symptoms).   Yes Historical Provider, MD  VIENVA 0.1-20 MG-MCG tablet Take 1 tablet by mouth daily. 12/10/16  Yes Historical Provider, MD    Family History Family History  Problem Relation Age of Onset  . Diabetes Mother   . Hypertension Father   . Cancer Brother   . Heart failure Other     Social History Social History  Substance Use Topics  . Smoking status: Current Every Day Smoker    Packs/day: 0.50    Types: Cigarettes  . Smokeless tobacco: Never Used   . Alcohol use Yes     Comment: occasinally     Allergies   Patient has no known allergies.   Review of Systems Review of Systems All other systems negative except as documented in the HPI. All pertinent positives and negatives as reviewed in the HPI.  Physical Exam Updated Vital Signs BP 115/85 (BP Location: Left Arm)   Pulse 87   Temp 98.5 F (36.9 C) (Oral)   Resp 18   Ht 5\' 9"  (1.753 m)   Wt 108.9 kg   SpO2 100%   BMI 35.44 kg/m   Physical Exam  Constitutional: She is oriented to person, place, and time. She appears well-developed and well-nourished. No distress.  HENT:  Head: Normocephalic and atraumatic.  Mouth/Throat: Oropharynx is clear and moist.  Eyes: Pupils are equal, round, and reactive to light.  Neck: Normal range of motion. Neck supple.  Cardiovascular: Normal rate, regular rhythm and normal heart sounds.  Exam reveals no gallop and no friction rub.   No murmur heard. Pulmonary/Chest: Effort normal and breath sounds normal. No respiratory distress. She has no wheezes.  Abdominal: Soft. Bowel sounds are normal. She exhibits no distension. There is no tenderness.  Genitourinary: Pelvic exam was performed with patient supine. There is no rash or tenderness on the right labia. There is no rash or tenderness on the left labia. Cervix exhibits no motion tenderness and no  friability. Right adnexum displays no mass, no tenderness and no fullness. Left adnexum displays tenderness. Left adnexum displays no mass and no fullness. There is bleeding in the vagina.  Neurological: She is alert and oriented to person, place, and time. She exhibits normal muscle tone. Coordination normal.  Skin: Skin is warm and dry. No rash noted. No erythema.  Psychiatric: She has a normal mood and affect. Her behavior is normal.  Nursing note and vitals reviewed.    ED Treatments / Results  Labs (all labs ordered are listed, but only abnormal results are displayed) Labs Reviewed    WET PREP, GENITAL - Abnormal; Notable for the following:       Result Value   WBC, Wet Prep HPF POC FEW (*)    All other components within normal limits  COMPREHENSIVE METABOLIC PANEL - Abnormal; Notable for the following:    Calcium 8.7 (*)    Total Protein 8.4 (*)    Alkaline Phosphatase 29 (*)    All other components within normal limits  CBC - Abnormal; Notable for the following:    Hemoglobin 11.7 (*)    MCV 74.9 (*)    MCH 24.2 (*)    RDW 16.9 (*)    All other components within normal limits  URINALYSIS, ROUTINE W REFLEX MICROSCOPIC - Abnormal; Notable for the following:    Hgb urine dipstick LARGE (*)    All other components within normal limits  LIPASE, BLOOD  RPR  I-STAT BETA HCG BLOOD, ED ([facility], [facility], [facility] ONLY)  GC/CHLAMYDIA PROBE AMP ([place]) NOT AT [facility]    EKG  EKG Interpretation None       Radiology US Transvaginal Non-ob  Result Date: 01/10/2017 CLINICAL DATA:  Pelvic pain for 1 week. EXAM: TRANSABDOMINAL AND TRANSVAGINAL ULTRASOUND OF PELVIS TECHNIQUE: Both transabdominal and transvaginal ultrasound examinations of the pelvis were performed. Transabdominal technique was performed for global imaging of the pelvis including uterus, ovaries, adnexal regions, and pelvic cul-de-sac. It was necessary to proceed with endovaginal exam following the transabdominal exam to visualize the right ovary. COMPARISON:  None FINDINGS: Uterus Measurements: 7.0 x 4.9 x 5.1 cm. No fibroids or other mass visualized. Endometrium Thickness: 2.0 cm.  No focal abnormality visualized. Right ovary Measurements: 3.0 x 2.4 x 1.5 cm. Normal appearance/no adnexal mass. Left ovary Measurements: 3.4 x 1.5 x 1.7 cm. Normal appearance/no adnexal mass. Other findings Trace pelvic free fluid, likely physiologic. IMPRESSION: 1. Diffuse endometrial thickening. Consider follow-up by [place] in 6-8 weeks, during the week immediately following menses (exam timing is critical). 2. Unremarkable appearance of the  ovaries. Electronically Signed   By: Sebastian Ache M.D.   On: 01/10/2017 12:56   US Pelvis Complete  Result Date: 01/10/2017 CLINICAL DATA:  Pelvic pain for 1 week. EXAM: TRANSABDOMINAL AND TRANSVAGINAL ULTRASOUND OF PELVIS TECHNIQUE: Both transabdominal and transvaginal ultrasound examinations of the pelvis were performed. Transabdominal technique was performed for global imaging of the pelvis including uterus, ovaries, adnexal regions, and pelvic cul-de-sac. It was necessary to proceed with endovaginal exam following the transabdominal exam to visualize the right ovary. COMPARISON:  None FINDINGS: Uterus Measurements: 7.0 x 4.9 x 5.1 cm. No fibroids or other mass visualized. Endometrium Thickness: 2.0 cm.  No focal abnormality visualized. Right ovary Measurements: 3.0 x 2.4 x 1.5 cm. Normal appearance/no adnexal mass. Left ovary Measurements: 3.4 x 1.5 x 1.7 cm. Normal appearance/no adnexal mass. Other findings Trace pelvic free fluid, likely physiologic. IMPRESSION: 1. Diffuse endometrial thickening. Consider follow-up by  [place] in 6-8 weeks, during the week immediately following menses (exam timing is critical). 2. Unremarkable appearance of the ovaries. Electronically Signed   By: Sebastian Ache M.D.   On: 01/10/2017 12:56    Procedures Procedures (including critical care time)  Medications Ordered in ED Medications  cefTRIAXone (ROCEPHIN) injection 250 mg (not administered)  azithromycin (ZITHROMAX) tablet 1,000 mg (not administered)     Initial Impression / Assessment and Plan / ED Course  I have reviewed the triage vital signs and the nursing notes.  Pertinent labs & imaging results that were available during my care of the patient were reviewed by me and considered in my medical decision making (see chart for details).     Patient be treated for PID based on the fact she has pain in the pelvis and adnexal area.   Final Clinical Impressions(s) / ED Diagnoses   Final diagnoses:  None     New Prescriptions New Prescriptions   No medications on file     Charlestine Night, PA-C 01/10/17 1414    Lyndal Pulley, MD 01/10/17 [phone]

## 2017-01-10 NOTE — ED Triage Notes (Signed)
Pt with pelvic pain x 1 week.  Pt seen x 2/26 for same. Pt told she has PCOS.  Pt told to come to ed if continues.  Pain in pelvic.  Some discharge.  Told to follow up with GYN but cannot wait that long.

## 2017-01-11 LAB — GC/CHLAMYDIA PROBE AMP (~~LOC~~) NOT AT ARMC
Chlamydia: NEGATIVE
Neisseria Gonorrhea: NEGATIVE

## 2017-01-11 LAB — RPR: RPR Ser Ql: NONREACTIVE

## 2017-02-03 ENCOUNTER — Other Ambulatory Visit: Payer: Self-pay | Admitting: Obstetrics and Gynecology

## 2017-02-03 DIAGNOSIS — N72 Inflammatory disease of cervix uteri: Secondary | ICD-10-CM | POA: Diagnosis not present

## 2017-02-03 DIAGNOSIS — N870 Mild cervical dysplasia: Secondary | ICD-10-CM | POA: Diagnosis not present

## 2017-02-03 DIAGNOSIS — R87612 Low grade squamous intraepithelial lesion on cytologic smear of cervix (LGSIL): Secondary | ICD-10-CM | POA: Diagnosis not present

## 2017-08-06 ENCOUNTER — Other Ambulatory Visit (HOSPITAL_COMMUNITY)
Admission: RE | Admit: 2017-08-06 | Discharge: 2017-08-06 | Disposition: A | Discharge status: Home / Self Care | Payer: BLUE CROSS/BLUE SHIELD | Source: Ambulatory Visit | Attending: Obstetrics and Gynecology | Admitting: Obstetrics and Gynecology

## 2017-08-06 ENCOUNTER — Other Ambulatory Visit: Payer: Self-pay | Admitting: Obstetrics and Gynecology

## 2017-08-06 DIAGNOSIS — Z01419 Encounter for gynecological examination (general) (routine) without abnormal findings: Secondary | ICD-10-CM | POA: Insufficient documentation

## 2017-08-06 DIAGNOSIS — N870 Mild cervical dysplasia: Secondary | ICD-10-CM | POA: Diagnosis not present

## 2017-08-10 LAB — CYTOLOGY - PAP: Diagnosis: NEGATIVE

## 2017-10-10 IMAGING — US US PELVIS COMPLETE
1 series · 14 of 25 positions shown · non-contrast
Comparison: None

CLINICAL DATA: Pelvic pain for 1 week.

EXAM:
TRANSABDOMINAL AND TRANSVAGINAL ULTRASOUND OF PELVIS
TECHNIQUE: Both transabdominal and transvaginal ultrasound examinations of the
pelvis were performed. Transabdominal technique was performed for
global imaging of the pelvis including uterus, ovaries, adnexal
regions, and pelvic cul-de-sac. It was necessary to proceed with
endovaginal exam following the transabdominal exam to visualize the
right ovary.

[Series 1: us pelvis complete · 0.20mm/px · 14 of 78 slices shown]
[im 1/78]
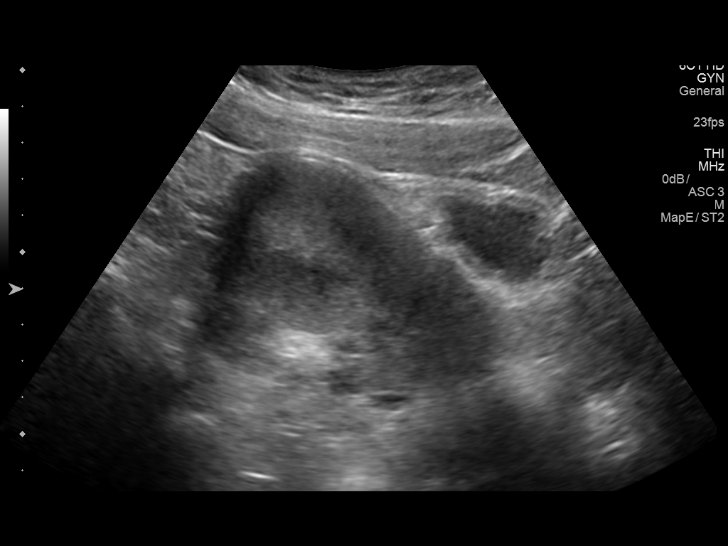
[im 7/78]
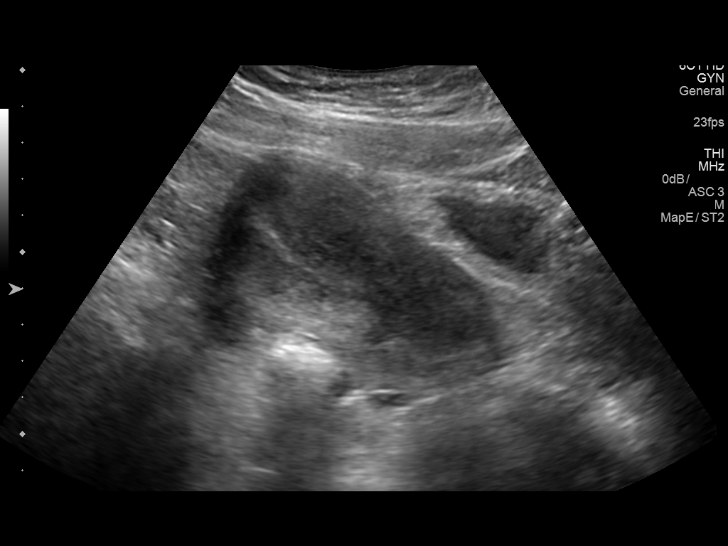
[im 13/78]
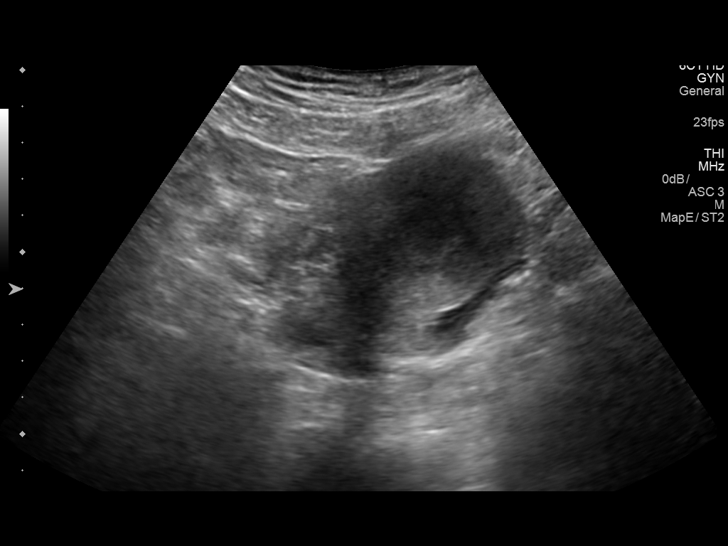
[im 20/78]
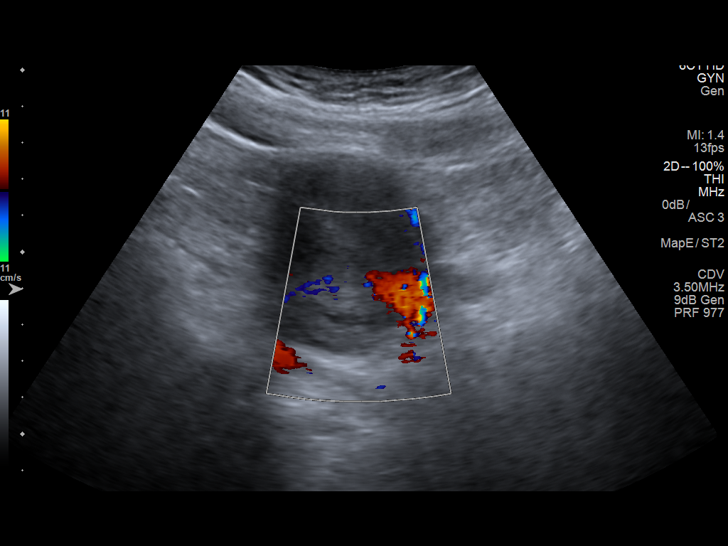
[im 26/78]
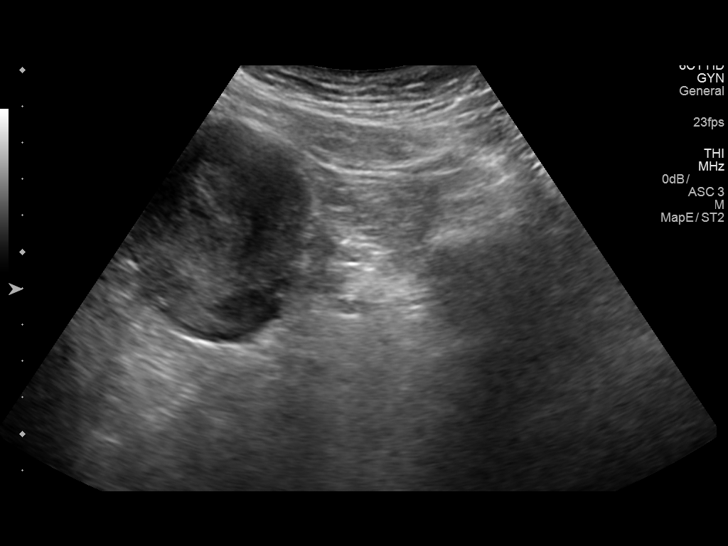
[im 29/78]
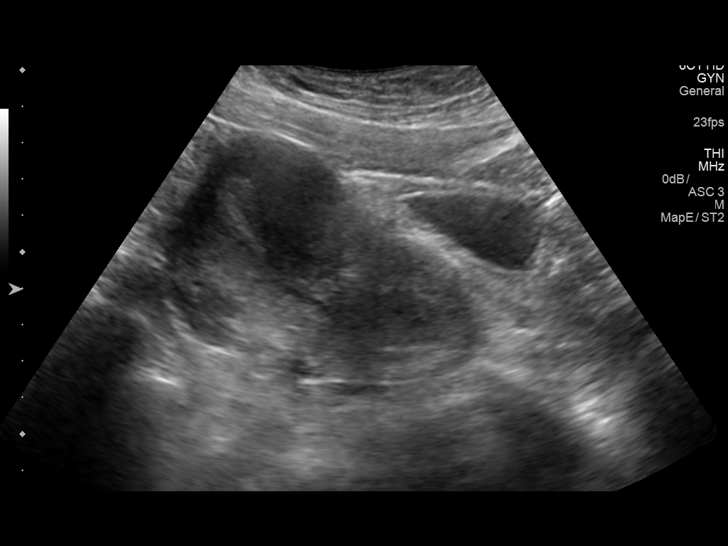
[im 36/78]
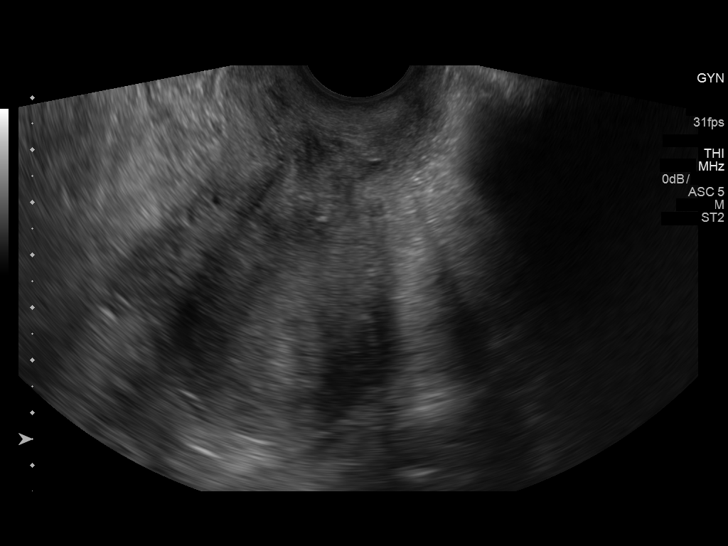
[im 42/78]
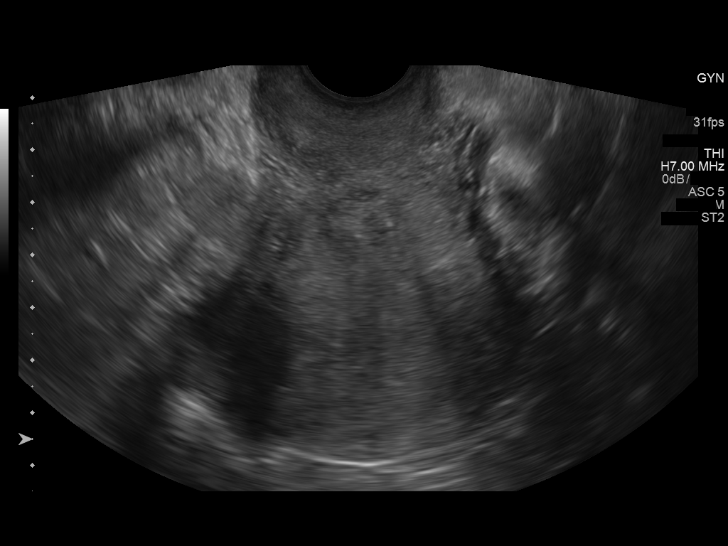
[im 49/78]
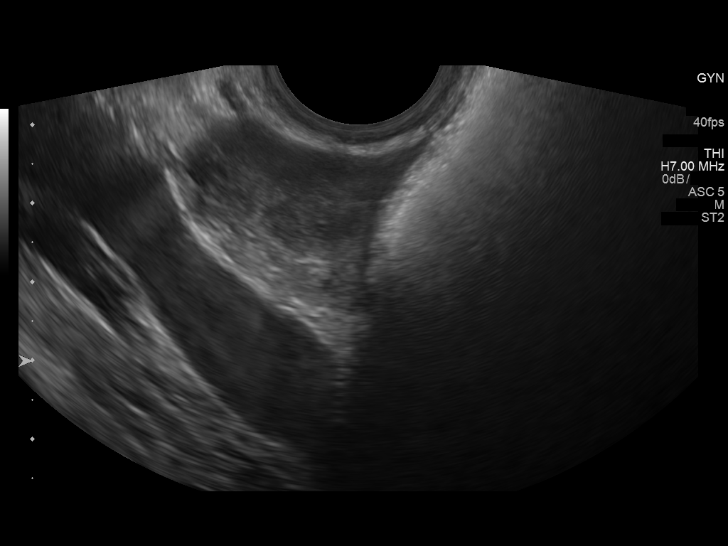
[im 52/78]
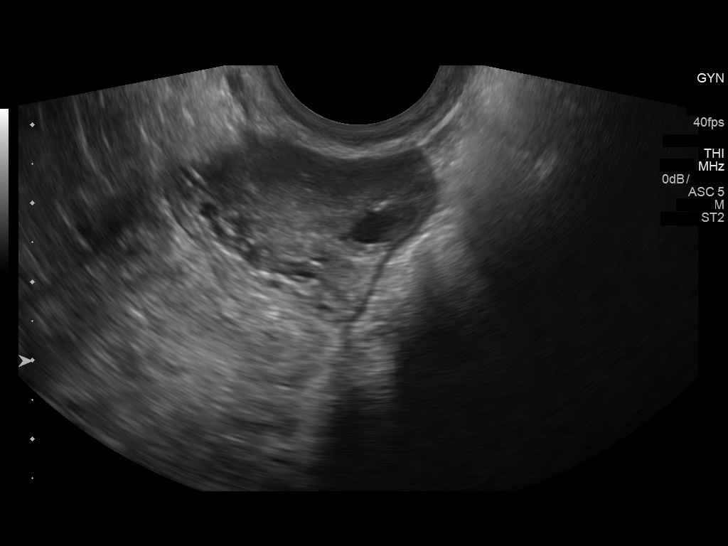
[im 58/78]
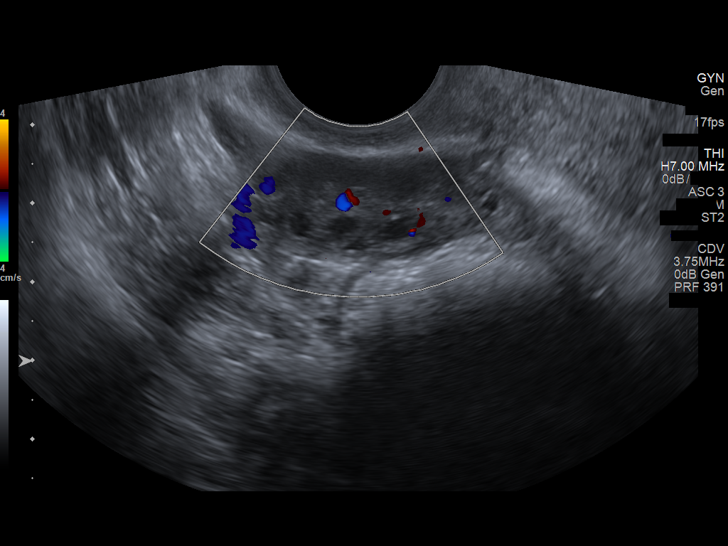
[im 65/78]
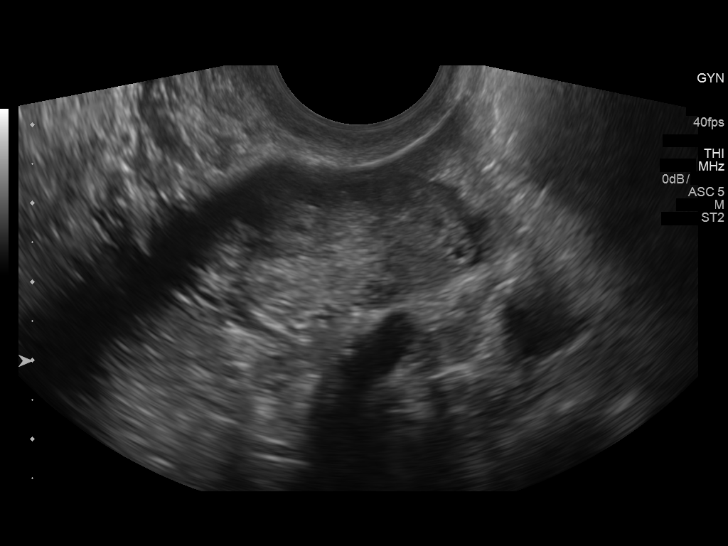
[im 71/78]
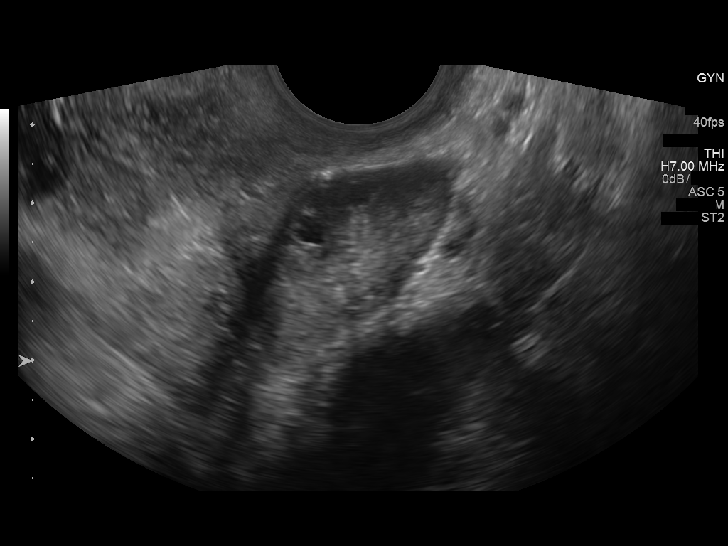
[im 78/78]
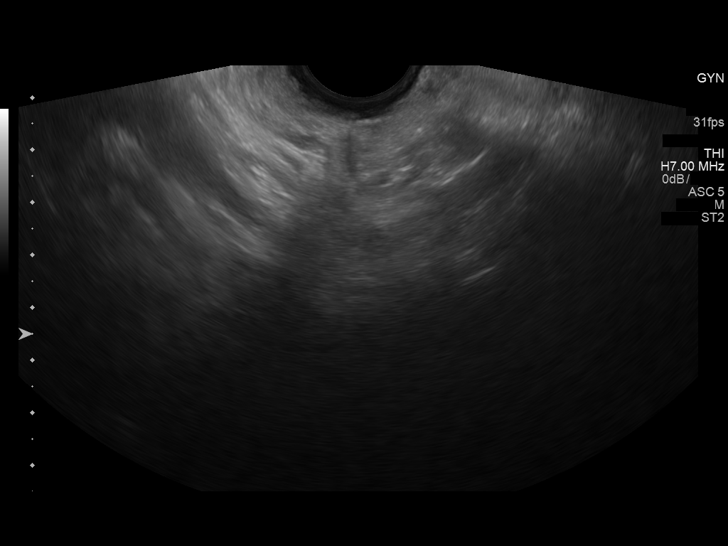

[14 of 25 positions shown; findings below may reference images not displayed]

FINDINGS: Uterus

Measurements: 7.0 x 4.9 x 5.1 cm. No fibroids or other mass
visualized.

Endometrium

Thickness: 2.0 cm.  No focal abnormality visualized.

Right ovary

Measurements: 3.0 x 2.4 x 1.5 cm. Normal appearance/no adnexal mass.

Left ovary

Measurements: 3.4 x 1.5 x 1.7 cm. Normal appearance/no adnexal mass.

Other findings

Trace pelvic free fluid, likely physiologic.
IMPRESSION: 1. Diffuse endometrial thickening. Consider follow-up by [place] in 6-8
weeks, during the week immediately following menses (exam timing is
critical).
2. Unremarkable appearance of the ovaries.

## 2018-08-22 ENCOUNTER — Encounter (HOSPITAL_COMMUNITY): Payer: Self-pay | Admitting: Family Medicine

## 2018-08-22 ENCOUNTER — Emergency Department (HOSPITAL_COMMUNITY)
Admission: EM | Admit: 2018-08-22 | Discharge: 2018-08-22 | Disposition: A | Discharge status: Home / Self Care | Payer: BLUE CROSS/BLUE SHIELD | Attending: Emergency Medicine | Admitting: Emergency Medicine

## 2018-08-22 DIAGNOSIS — R51 Headache: Secondary | ICD-10-CM | POA: Insufficient documentation

## 2018-08-22 DIAGNOSIS — Z5321 Procedure and treatment not carried out due to patient leaving prior to being seen by health care provider: Secondary | ICD-10-CM | POA: Insufficient documentation

## 2018-08-22 NOTE — ED Triage Notes (Signed)
Patient is complaining of right occipital headache that started about 1 week ago. Patient describes the pain as a constant pressure. Associated symptoms of nausea. Patient reports she had tried OTC medication that has helped but the pain immediately returns.

## 2019-01-06 ENCOUNTER — Ambulatory Visit (HOSPITAL_COMMUNITY)
Admission: EM | Admit: 2019-01-06 | Discharge: 2019-01-06 | Disposition: A | Discharge status: Home / Self Care | Payer: BLUE CROSS/BLUE SHIELD | Attending: Physician Assistant | Admitting: Physician Assistant

## 2019-01-06 ENCOUNTER — Other Ambulatory Visit: Payer: Self-pay

## 2019-01-06 ENCOUNTER — Encounter (HOSPITAL_COMMUNITY): Payer: Self-pay | Admitting: Emergency Medicine

## 2019-01-06 DIAGNOSIS — H938X1 Other specified disorders of right ear: Secondary | ICD-10-CM

## 2019-01-06 MED ORDER — PREDNISONE 50 MG PO TABS: 50.0000 mg | ORAL_TABLET | Freq: Every day | ORAL | 0 refills | Status: DC

## 2019-01-06 NOTE — ED Provider Notes (Signed)
[facility]    CSN: [id number] Arrival date & time: 01/06/19  1506     History   Chief Complaint Chief Complaint  Patient presents with  . Insect Bite    HPI [name] is a 28 y.o. female.   28 year old female comes in for possible insect bites. States she has itching to the right ear and back of her left foot. States would like the right ear to be checked as swelling is worse. Area is itching without pain. Denies spreading erythema, warmth. Denies fever, chills, night sweats. Has continued to scratch on the area. Has not tried anything for the symptoms.      Past Medical History:  Diagnosis Date  . Kikuchi disease   . Sinusitis     There are no active problems to display for this patient.   Past Surgical History:  Procedure Laterality Date  . LYMPHADENECTOMY    . UVULECTOMY      OB History   No obstetric history on file.      Home Medications    Prior to Admission medications   Medication Sig Start Date End Date Taking? Authorizing Provider  ibuprofen (ADVIL,MOTRIN) 200 MG tablet Take 200 mg by mouth every 6 (six) hours as needed for cramping.    [provider]  predniSONE (DELTASONE) 50 MG tablet Take 1 tablet (50 mg total) by mouth daily. 01/06/19   Cathie Hoops, Amy V, PA-C  Pseudoeph-Doxylamine-DM-APAP (TYLENOL COLD/FLU SEVERE NIGHT PO) Take 15 mLs by mouth at bedtime as needed (for cold symptoms).    [provider]  VIENVA 0.1-20 MG-MCG tablet Take 1 tablet by mouth daily. 12/10/16   [provider]    Family History Family History  Problem Relation Age of Onset  . Diabetes Mother   . Hypertension Father   . Cancer Brother   . Heart failure Other     Social History Social History   Tobacco Use  . Smoking status: Current Some Day Smoker    Packs/day: 0.25    Types: Cigarettes  . Smokeless tobacco: Never Used  Substance Use Topics  . Alcohol use: Yes    Comment: 2 times a  week   . Drug use: No      Allergies   Patient has no known allergies.   Review of Systems Review of Systems  Reason unable to perform ROS: See HPI as above.     Physical Exam Triage Vital Signs ED Triage Vitals  Enc Vitals Group     BP 01/06/19 1543 116/78     Pulse Rate 01/06/19 1543 83     Resp --      Temp 01/06/19 1543 99 F (37.2 C)     Temp Source 01/06/19 1543 Oral     SpO2 01/06/19 1543 100 %     Weight --      Height --      Head Circumference --      Peak Flow --      Pain Score 01/06/19 1541 0     Pain Loc --      Pain Edu? --      Excl. in GC? --    No data found.  Updated Vital Signs BP 116/78 (BP Location: Right Arm)   Pulse 83   Temp 99 F (37.2 C) (Oral)   LMP 12/21/2018 (Exact Date)   SpO2 100%   Physical Exam Constitutional:      General: She is not in acute  distress.    Appearance: She is well-developed. She is not ill-appearing, toxic-appearing or diaphoretic.     Comments: Laughing with friend, in no acute distress.   HENT:     Head: Normocephalic and atraumatic.     Ears:     Comments: Diffuse swelling of right external ear. Mild erythema with warmth. No tenderness to palpation. No fluctuance felt. No signs of swelling to the ear canal. Cerumen impaction, TM not visible.  Eyes:     Conjunctiva/sclera: Conjunctivae normal.     Pupils: Pupils are equal, round, and reactive to light.  Neurological:     Mental Status: She is alert and oriented to person, place, and time.      UC Treatments / Results  Labs (all labs ordered are listed, but only abnormal results are displayed) Labs Reviewed - No data to display  EKG None  Radiology No results found.  Procedures Procedures (including critical care time)  Medications Ordered in UC Medications - No data to display  Initial Impression / Assessment and Plan / UC Course  I have reviewed the triage vital signs and the nursing notes.  Pertinent labs & imaging results that were available during my  care of the patient were reviewed by me and considered in my medical decision making (see chart for details).    Prednisone for swelling/inflammation. Ice compress, antihistamine as directed. Refrain from scratching. Return precautions given. Patient expresses understanding and agrees to plan.  Final Clinical Impressions(s) / UC Diagnoses   Final diagnoses:  Irritation of right ear    ED Prescriptions    Medication Sig Dispense Auth. Provider   predniSONE (DELTASONE) 50 MG tablet Take 1 tablet (50 mg total) by mouth daily. 5 tablet Threasa Alpha, [place] 01/06/19 [phone]

## 2019-01-06 NOTE — ED Triage Notes (Signed)
Pt complains of two possible insect bites.  One to her right ear and one to the back of her left foot.  She states she noticed them yesterday, but today she has had some swelling in the areas and lightheadedness.

## 2019-01-06 NOTE — Discharge Instructions (Signed)
You can take prednisone to help with inflammation, swelling. Ice compress, over the counter allergy medicine. Refrain from scratching. Monitor for spreading redness, increased warmth, fever, pain, follow up for reevaluation needed.

## 2019-01-07 LAB — HM PAP SMEAR: HM Pap smear: NORMAL

## 2019-01-07 LAB — RESULTS CONSOLE HPV: CHL HPV: NEGATIVE

## 2019-08-23 ENCOUNTER — Emergency Department (HOSPITAL_BASED_OUTPATIENT_CLINIC_OR_DEPARTMENT_OTHER)
Admission: EM | Admit: 2019-08-23 | Discharge: 2019-08-23 | Disposition: A | Discharge status: Home / Self Care | Payer: BLUE CROSS/BLUE SHIELD | Attending: Emergency Medicine | Admitting: Emergency Medicine

## 2019-08-23 ENCOUNTER — Encounter (HOSPITAL_BASED_OUTPATIENT_CLINIC_OR_DEPARTMENT_OTHER): Payer: Self-pay | Admitting: *Deleted

## 2019-08-23 ENCOUNTER — Other Ambulatory Visit: Payer: Self-pay

## 2019-08-23 DIAGNOSIS — F17210 Nicotine dependence, cigarettes, uncomplicated: Secondary | ICD-10-CM | POA: Insufficient documentation

## 2019-08-23 DIAGNOSIS — B349 Viral infection, unspecified: Secondary | ICD-10-CM | POA: Insufficient documentation

## 2019-08-23 DIAGNOSIS — Z20828 Contact with and (suspected) exposure to other viral communicable diseases: Secondary | ICD-10-CM | POA: Insufficient documentation

## 2019-08-23 MED ORDER — ACETAMINOPHEN 325 MG PO TABS
650.0000 mg | ORAL_TABLET | Freq: Once | ORAL | Status: AC
  Administered 2019-08-23: 650 mg via ORAL
  Filled 2019-08-23: qty 2

## 2019-08-23 NOTE — Discharge Instructions (Signed)
Take tylenol 2 pills 4 times a day and motrin 4 pills 3 times a day.  Drink plenty of fluids.  Return for worsening shortness of breath, headache, confusion. Follow up with your family doctor.      Person Under Monitoring Name: [name]  Location: [place] [address]   Infection Prevention Recommendations for Individuals Confirmed to have, or Being Evaluated for, 2019 Novel Coronavirus (COVID-19) Infection Who Receive Care at Home  Individuals who are confirmed to have, or are being evaluated for, COVID-19 should follow the prevention steps below until a healthcare provider or local or state health department says they can return to normal activities.  Stay home except to get medical care You should restrict activities outside your home, except for getting medical care. Do not go to work, school, or public areas, and do not use public transportation or taxis.  Call ahead before visiting your doctor Before your medical appointment, call the healthcare provider and tell them that you have, or are being evaluated for, COVID-19 infection. This will help the healthcare providers office take steps to keep other people from getting infected. Ask your healthcare provider to call the local or state health department.  Monitor your symptoms Seek prompt medical attention if your illness is worsening (e.g., difficulty breathing). Before going to your medical appointment, call the healthcare provider and tell them that you have, or are being evaluated for, COVID-19 infection. Ask your healthcare provider to call the local or state health department.  Wear a facemask You should wear a facemask that covers your nose and mouth when you are in the same room with other people and when you visit a healthcare provider. People who live with or visit you should also wear a facemask while they are in the same room with you.  Separate yourself from other people in your home As  much as possible, you should stay in a different room from other people in your home. Also, you should use a separate bathroom, if available.  Avoid sharing household items You should not share dishes, drinking glasses, cups, eating utensils, towels, bedding, or other items with other people in your home. After using these items, you should wash them thoroughly with soap and water.  Cover your coughs and sneezes Cover your mouth and nose with a tissue when you cough or sneeze, or you can cough or sneeze into your sleeve. Throw used tissues in a lined trash can, and immediately wash your hands with soap and water for at least 20 seconds or use an alcohol-based hand rub.  Wash your [facility] your hands often and thoroughly with soap and water for at least 20 seconds. You can use an alcohol-based hand sanitizer if soap and water are not available and if your hands are not visibly dirty. Avoid touching your eyes, nose, and mouth with unwashed hands.   Prevention Steps for Caregivers and Household Members of Individuals Confirmed to have, or Being Evaluated for, COVID-19 Infection Being Cared for in the Home  If you live with, or provide care at home for, a person confirmed to have, or being evaluated for, COVID-19 infection please follow these guidelines to prevent infection:  Follow healthcare providers instructions Make sure that you understand and can help the patient follow any healthcare provider instructions for all care.  Provide for the patients basic needs You should help the patient with basic needs in the home and provide support for getting groceries, prescriptions, and other personal  needs.  Monitor the patients symptoms If they are getting sicker, call his or her medical provider and tell them that the patient has, or is being evaluated for, COVID-19 infection. This will help the healthcare providers office take steps to keep other people from getting infected. Ask  the healthcare provider to call the local or state health department.  Limit the number of people who have contact with the patient If possible, have only one caregiver for the patient. Other household members should stay in another home or place of residence. If this is not possible, they should stay in another room, or be separated from the patient as much as possible. Use a separate bathroom, if available. Restrict visitors who do not have an essential need to be in the home.  Keep older adults, very young children, and other sick people away from the patient Keep older adults, very young children, and those who have compromised immune systems or chronic health conditions away from the patient. This includes people with chronic heart, lung, or kidney conditions, diabetes, and cancer.  Ensure good ventilation Make sure that shared spaces in the home have good air flow, such as from an air conditioner or an opened window, weather permitting.  Wash your hands often Wash your hands often and thoroughly with soap and water for at least 20 seconds. You can use an alcohol based hand sanitizer if soap and water are not available and if your hands are not visibly dirty. Avoid touching your eyes, nose, and mouth with unwashed hands. Use disposable paper towels to dry your hands. If not available, use dedicated cloth towels and replace them when they become wet.  Wear a facemask and gloves Wear a disposable facemask at all times in the room and gloves when you touch or have contact with the patients blood, body fluids, and/or secretions or excretions, such as sweat, saliva, sputum, nasal mucus, vomit, urine, or feces.  Ensure the mask fits over your nose and mouth tightly, and do not touch it during use. Throw out disposable facemasks and gloves after using them. Do not reuse. Wash your hands immediately after removing your facemask and gloves. If your personal clothing becomes contaminated,  carefully remove clothing and launder. Wash your hands after handling contaminated clothing. Place all used disposable facemasks, gloves, and other waste in a lined container before disposing them with other household waste. Remove gloves and wash your hands immediately after handling these items.  Do not share dishes, glasses, or other household items with the patient Avoid sharing household items. You should not share dishes, drinking glasses, cups, eating utensils, towels, bedding, or other items with a patient who is confirmed to have, or being evaluated for, COVID-19 infection. After the person uses these items, you should wash them thoroughly with soap and water.  Wash laundry thoroughly Immediately remove and wash clothes or bedding that have blood, body fluids, and/or secretions or excretions, such as sweat, saliva, sputum, nasal mucus, vomit, urine, or feces, on them. Wear gloves when handling laundry from the patient. Read and follow directions on labels of laundry or clothing items and detergent. In general, wash and dry with the warmest temperatures recommended on the label.  Clean all areas the individual has used often Clean all touchable surfaces, such as counters, tabletops, doorknobs, bathroom fixtures, toilets, phones, keyboards, tablets, and bedside tables, every day. Also, clean any surfaces that may have blood, body fluids, and/or secretions or excretions on them. Wear gloves when cleaning surfaces the  patient has come in contact with. Use a diluted bleach solution (e.g., dilute bleach with 1 part bleach and 10 parts water) or a household disinfectant with a label that says EPA-registered for coronaviruses. To make a bleach solution at home, add 1 tablespoon of bleach to 1 quart (4 cups) of water. For a larger supply, add  cup of bleach to 1 gallon (16 cups) of water. Read labels of cleaning products and follow recommendations provided on product labels. Labels contain  instructions for safe and effective use of the cleaning product including precautions you should take when applying the product, such as wearing gloves or eye protection and making sure you have good ventilation during use of the product. Remove gloves and wash hands immediately after cleaning.  Monitor yourself for signs and symptoms of illness Caregivers and household members are considered close contacts, should monitor their health, and will be asked to limit movement outside of the home to the extent possible. Follow the monitoring steps for close contacts listed on the symptom monitoring form.   ? If you have additional questions, contact your local health department or call the epidemiologist on call at [phone] (available 24/7). ? This guidance is subject to change. For the most up-to-date guidance from [facility], please refer to their website: [email or web address]

## 2019-08-23 NOTE — ED Provider Notes (Signed)
[facility] EMERGENCY DEPARTMENT Provider Note   CSN: [id number] Arrival date & time: 08/23/19  1936     History   Chief Complaint Chief Complaint  Patient presents with  . Generalized Body Aches    HPI [name] is a 28 y.o. female.     28 yo F with a chief complaints of fever.  Going on for the past couple days along with some muscle aches.  She has had some mild abdominal discomfort, cough.  Otherwise denies loss of taste or smell denies nausea vomiting or diarrhea.  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  The history is provided by the patient.  Illness Severity:  Moderate Onset quality:  Gradual Duration:  2 days Timing:  Constant Progression:  Worsening Chronicity:  New Associated symptoms: fever and myalgias   Associated symptoms: no chest pain, no congestion, no headaches, no nausea, no rhinorrhea, no shortness of breath, no vomiting and no wheezing     Past Medical History:  Diagnosis Date  . Kikuchi disease   . Sinusitis     There are no active problems to display for this patient.   Past Surgical History:  Procedure Laterality Date  . LYMPHADENECTOMY    . UVULECTOMY       OB History   No obstetric history on file.      Home Medications    Prior to Admission medications   Medication Sig Start Date End Date Taking? Authorizing Provider  ibuprofen (ADVIL,MOTRIN) 200 MG tablet Take 200 mg by mouth every 6 (six) hours as needed for cramping.    [provider]  predniSONE (DELTASONE) 50 MG tablet Take 1 tablet (50 mg total) by mouth daily. 01/06/19   [name], [name], [name]  Pseudoeph-Doxylamine-DM-APAP (TYLENOL COLD/FLU SEVERE NIGHT PO) Take 15 mLs by mouth at bedtime as needed (for cold symptoms).    [provider]  VIENVA 0.1-20 MG-MCG tablet Take 1 tablet by mouth daily. 12/10/16   [provider]    Family History Family History  Problem Relation Age of Onset  .  Diabetes Mother   . Hypertension Father   . Cancer Brother   . Heart failure Other     Social History Social History   Tobacco Use  . Smoking status: Current Some Day Smoker    Packs/day: 0.50    Types: Cigarettes  . Smokeless tobacco: Never Used  Substance Use Topics  . Alcohol use: Yes    Comment: 2 times a  week   . Drug use: No     Allergies   Patient has no known allergies.   Review of Systems Review of Systems  Constitutional: Positive for chills and fever.  HENT: Negative for congestion and rhinorrhea.   Eyes: Negative for redness and visual disturbance.  Respiratory: Negative for shortness of breath and wheezing.   Cardiovascular: Negative for chest pain and palpitations.  Gastrointestinal: Negative for nausea and vomiting.  Genitourinary: Negative for dysuria and urgency.  Musculoskeletal: Positive for myalgias. Negative for arthralgias.  Skin: Negative for pallor and wound.  Neurological: Negative for dizziness and headaches.     Physical Exam Updated Vital Signs BP 120/83 (BP Location: Right Arm)   Pulse 74   Temp (!) 100.8 F (38.2 C) (Oral) Comment: RN Tina BraunKaren informed  Resp 19   Ht 5\' 9"  (1.753 m)   Wt 103.7 kg   LMP 07/24/2019   SpO2 100%   BMI 33.77 kg/m  Physical Exam Vitals signs and nursing note reviewed.  Constitutional:      General: She is not in acute distress.    Appearance: She is well-developed. She is not diaphoretic.  HENT:     Head: Normocephalic and atraumatic.  Eyes:     Pupils: Pupils are equal, round, and reactive to light.  Neck:     Musculoskeletal: Normal range of motion and neck supple.  Cardiovascular:     Rate and Rhythm: Normal rate and regular rhythm.     Heart sounds: No murmur. No friction rub. No gallop.   Pulmonary:     Effort: Pulmonary effort is normal.     Breath sounds: No wheezing or rales.  Abdominal:     General: There is no distension.     Palpations: Abdomen is soft.     Tenderness: There is  no abdominal tenderness.  Musculoskeletal:        General: No tenderness.  Skin:    General: Skin is warm and dry.  Neurological:     Mental Status: She is alert and oriented to person, place, and time.  Psychiatric:        Behavior: Behavior normal.      ED Treatments / Results  Labs (all labs ordered are listed, but only abnormal results are displayed) Labs Reviewed  NOVEL CORONAVIRUS, NAA (HOSP ORDER, SEND-OUT TO REF LAB; TAT 18-24 HRS)    EKG None  Radiology No results found.  Procedures Procedures (including critical care time)  Medications Ordered in ED Medications  acetaminophen (TYLENOL) tablet 650 mg (650 mg Oral Given 08/23/19 1953)     Initial Impression / Assessment and Plan / ED Course  I have reviewed the triage vital signs and the nursing notes.  Pertinent labs & imaging results that were available during my care of the patient were reviewed by me and considered in my medical decision making (see chart for details).        28 yo F with a chief complaints of febrile illness.  Going on for the past couple days.  She is well-appearing and nontoxic she has clear lung sounds she is mildly tachycardic which I think is due to her height of fever.  She has no abdominal tenderness.  We will send an outpatient coronavirus test.  PCP follow-up.  [name] was evaluated in Emergency Department on 08/23/2019 for the symptoms described in the history of present illness. He/she was evaluated in the context of the global COVID-19 pandemic, which necessitated consideration that the patient might be at risk for infection with the SARS-CoV-2 virus that causes COVID-19. Institutional protocols and algorithms that pertain to the evaluation of patients at risk for COVID-19 are in a state of rapid change based on information released by regulatory bodies including the CDC and federal and state organizations. These policies and algorithms were followed during the patient's  care in the ED.  10:22 PM:  I have discussed the diagnosis/risks/treatment options with the patient and believe the pt to be eligible for discharge home to follow-up with PCP. We also discussed returning to the ED immediately if new or worsening sx occur. We discussed the sx which are most concerning (e.g., sudden worsening pain, fever, inability to tolerate by mouth) that necessitate immediate return. Medications administered to the patient during their visit and any new prescriptions provided to the patient are listed below.  Medications given during this visit Medications  acetaminophen (TYLENOL) tablet 650 mg (650 mg Oral Given 08/23/19 1953)  The patient appears reasonably screen and/or stabilized for discharge and I doubt any other medical condition or other [facility] requiring further screening, evaluation, or treatment in the ED at this time prior to discharge.    Final Clinical Impressions(s) / ED Diagnoses   Final diagnoses:  Viral syndrome    ED Discharge Orders    None       Melene Plan, DO 08/23/19 2222

## 2019-08-23 NOTE — ED Notes (Signed)
ED Provider at bedside. 

## 2019-08-23 NOTE — ED Triage Notes (Addendum)
Pt c/o body aches , fever 102.0 at home  x 2 days, family at home with same

## 2019-08-25 LAB — NOVEL CORONAVIRUS, NAA (HOSP ORDER, SEND-OUT TO REF LAB; TAT 18-24 HRS): SARS-CoV-2, NAA: NOT DETECTED

## 2019-11-08 ENCOUNTER — Ambulatory Visit: Payer: Self-pay | Attending: Internal Medicine

## 2019-11-08 DIAGNOSIS — Z20822 Contact with and (suspected) exposure to covid-19: Secondary | ICD-10-CM

## 2019-11-08 DIAGNOSIS — Z20828 Contact with and (suspected) exposure to other viral communicable diseases: Secondary | ICD-10-CM | POA: Insufficient documentation

## 2019-11-09 LAB — NOVEL CORONAVIRUS, NAA: SARS-CoV-2, NAA: NOT DETECTED

## 2020-11-09 DIAGNOSIS — E282 Polycystic ovarian syndrome: Secondary | ICD-10-CM

## 2020-11-09 HISTORY — DX: Polycystic ovarian syndrome: E28.2

## 2021-11-10 ENCOUNTER — Other Ambulatory Visit: Payer: Self-pay

## 2021-11-10 ENCOUNTER — Emergency Department (HOSPITAL_BASED_OUTPATIENT_CLINIC_OR_DEPARTMENT_OTHER): Payer: No Typology Code available for payment source | Admitting: Radiology

## 2021-11-10 ENCOUNTER — Encounter (HOSPITAL_BASED_OUTPATIENT_CLINIC_OR_DEPARTMENT_OTHER): Payer: Self-pay | Admitting: Obstetrics and Gynecology

## 2021-11-10 DIAGNOSIS — R0602 Shortness of breath: Secondary | ICD-10-CM | POA: Insufficient documentation

## 2021-11-10 DIAGNOSIS — M549 Dorsalgia, unspecified: Secondary | ICD-10-CM | POA: Insufficient documentation

## 2021-11-10 DIAGNOSIS — R072 Precordial pain: Secondary | ICD-10-CM | POA: Diagnosis present

## 2021-11-10 DIAGNOSIS — Z20822 Contact with and (suspected) exposure to covid-19: Secondary | ICD-10-CM | POA: Diagnosis not present

## 2021-11-10 LAB — CBC
HCT: 39.1 % (ref 36.0–46.0)
Hemoglobin: 12.5 g/dL (ref 12.0–15.0)
MCH: 26 pg (ref 26.0–34.0)
MCHC: 32 g/dL (ref 30.0–36.0)
MCV: 81.3 fL (ref 80.0–100.0)
Platelets: 230 10*3/uL (ref 150–400)
RBC: 4.81 MIL/uL (ref 3.87–5.11)
RDW: 15 % (ref 11.5–15.5)
WBC: 5.3 10*3/uL (ref 4.0–10.5)
nRBC: 0 % (ref 0.0–0.2)

## 2021-11-10 LAB — BASIC METABOLIC PANEL
Anion gap: 7 (ref 5–15)
BUN: 8 mg/dL (ref 6–20)
CO2: 25 mmol/L (ref 22–32)
Calcium: 9 mg/dL (ref 8.9–10.3)
Chloride: 105 mmol/L (ref 98–111)
Creatinine, Ser: 0.66 mg/dL (ref 0.44–1.00)
GFR, Estimated: >60 mL/min (ref >60)
Glucose, Bld: 95 mg/dL (ref 70–99)
Potassium: 3.7 mmol/L (ref 3.5–5.1)
Sodium: 137 mmol/L (ref 135–145)

## 2021-11-10 LAB — TROPONIN I (HIGH SENSITIVITY): Troponin I (High Sensitivity): <2 ng/L (ref <18)

## 2021-11-10 LAB — PREGNANCY, URINE: Preg Test, Ur: NEGATIVE

## 2021-11-10 NOTE — ED Triage Notes (Signed)
Patient reports she noticed some shortness of breath yesterday and then today the [facility] developed more and she started having chest pain that has gotten more severe as the day goes on. Denies sick exposure. Reports no emesis or diarrhea.

## 2021-11-11 ENCOUNTER — Emergency Department (HOSPITAL_BASED_OUTPATIENT_CLINIC_OR_DEPARTMENT_OTHER)
Admission: EM | Admit: 2021-11-11 | Discharge: 2021-11-11 | Disposition: A | Discharge status: Home / Self Care | Payer: No Typology Code available for payment source | Attending: Emergency Medicine | Admitting: Emergency Medicine

## 2021-11-11 DIAGNOSIS — R072 Precordial pain: Secondary | ICD-10-CM

## 2021-11-11 LAB — TROPONIN I (HIGH SENSITIVITY): Troponin I (High Sensitivity): <2 ng/L (ref <18)

## 2021-11-11 LAB — RESP PANEL BY RT-PCR (FLU A&B, COVID) ARPGX2
Influenza A by PCR: NEGATIVE
Influenza B by PCR: NEGATIVE
SARS Coronavirus 2 by RT PCR: NEGATIVE

## 2021-11-11 MED ORDER — IBUPROFEN 400 MG PO TABS
400.0000 mg | ORAL_TABLET | Freq: Once | ORAL | Status: AC
Start: 2021-11-11 — End: 2021-11-11
  Administered 2021-11-11: 400 mg via ORAL
  Filled 2021-11-11: qty 1

## 2021-11-11 MED ORDER — HYDROCODONE-ACETAMINOPHEN 5-325 MG PO TABS
1.0000 | ORAL_TABLET | Freq: Once | ORAL | Status: AC
  Administered 2021-11-11: 1 via ORAL
  Filled 2021-11-11: qty 1

## 2021-11-11 NOTE — Discharge Instructions (Signed)

## 2021-11-11 NOTE — ED Provider Notes (Signed)
[facility] EMERGENCY DEPT Provider Note   CSN: [id number] Arrival date & time: 11/10/21  2233     History  Chief Complaint  Patient presents with   Chest Pain    [name] is a 31 y.o. female.  The history is provided by the patient.  Chest Pain Pain location:  Substernal area Pain quality: pressure   Pain radiates to:  Does not radiate Pain severity:  Moderate Onset quality:  Gradual Timing:  Constant Progression:  Improving Chronicity:  New Relieved by:  Nothing Worsened by:  Movement (Twisting of torso, lifting her arm) Associated symptoms: back pain and shortness of breath   Associated symptoms: no abdominal pain, no cough, no diaphoresis, no fever, no lower extremity edema and no vomiting   Associated symptoms comment:  She did have 1 isolated episode of back pain Risk factors: obesity   Risk factors: no birth control, no coronary artery disease, no diabetes mellitus, no hypertension and no prior DVT/PE   Patient presents with chest pain.  She reports yesterday her pain started gradually.  It occurred at rest.  It feels like soreness and pressure. She reports it is worse with any movement in the bed, or any twisting of her torso.  She also reports when she was eating earlier her chest hurt just with movement of her arm   No known history of CAD/VTE. 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 Authorizing Provider  Pseudoeph-Doxylamine-DM-APAP (TYLENOL COLD/FLU SEVERE NIGHT PO) Take 15 mLs by mouth at bedtime as needed (for cold symptoms).    [provider]  VIENVA 0.1-20 MG-MCG tablet Take 1 tablet by mouth daily. 12/10/16   [provider]      Allergies    Patient has no known allergies.    Review of Systems   Review of Systems  Constitutional:  Negative for diaphoresis and fever.  Respiratory:  Positive for shortness of  breath. Negative for cough.   Cardiovascular:  Positive for chest pain.  Gastrointestinal:  Negative for abdominal pain and vomiting.  Musculoskeletal:  Positive for back pain.  All other systems reviewed and are negative.  Physical Exam Updated Vital Signs BP (!) 131/94    Pulse 79    Temp 98.2 F (36.8 C)    Resp 17    Ht 1.753 m (5\' 9" )    Wt 122.5 kg    LMP 10/15/2021 (Approximate)    SpO2 100%    BMI 39.87 kg/m  Physical Exam CONSTITUTIONAL: Well developed/well nourished no distress, resting comfortably, smiling HEAD: Normocephalic/atraumatic EYES: EOMI/PERRL ENMT: Mucous membranes moist NECK: supple no meningeal signs SPINE/BACK:entire spine nontender CV: S1/S2 noted, no murmurs/rubs/gallops noted LUNGS: Lungs are clear to auscultation bilaterally, no apparent distress Chest-no tenderness to palpation, no rash or crepitus Patient reports she has pain in the chest with any movement in the bed ABDOMEN: soft, nontender, no rebound or guarding, bowel sounds noted throughout abdomen GU:no cva tenderness NEURO: Pt is awake/alert/appropriate, moves all extremitiesx4.  No facial droop.   EXTREMITIES: pulses normal/equal, full ROM, no calf tenderness or edema SKIN: warm, color normal PSYCH: no abnormalities of mood noted, alert and oriented to situation  ED Results / Procedures / Treatments   Labs (all labs ordered are listed, but only abnormal results are displayed) Labs Reviewed  RESP PANEL BY RT-PCR (FLU A&B, COVID) ARPGX2  BASIC METABOLIC PANEL  CBC  PREGNANCY, URINE  TROPONIN  I (HIGH SENSITIVITY)  TROPONIN I (HIGH SENSITIVITY)    EKG EKG Interpretation  Date/Time:  Monday November 10 2021 22:41:57 EST Ventricular Rate:  85 PR Interval:  134 QRS Duration: 82 QT Interval:  364 QTC Calculation: 433 R Axis:   30 Text Interpretation: Normal sinus rhythm Normal ECG When compared with ECG of 28-Jul-2014 07:02, PREVIOUS ECG IS PRESENT No significant change since last  tracing Confirmed by Ripley Fraise [phone]) on 11/11/2021 5:51:01 AM  Radiology DG Chest 2 View  Result Date: 11/10/2021 CLINICAL DATA:  Shortness of breath. EXAM: CHEST - 2 VIEW COMPARISON:  November 10, 2013 FINDINGS: The heart size and mediastinal contours are within normal limits. Both lungs are clear. The visualized skeletal structures are unremarkable. IMPRESSION: No active cardiopulmonary disease. Electronically Signed   By: Virgina Norfolk M.D.   On: 11/10/2021 23:21    Procedures Procedures    Medications Ordered in ED Medications  HYDROcodone-acetaminophen (NORCO/VICODIN) 5-325 MG per tablet 1 tablet (1 tablet Oral Given 11/11/21 0621)  ibuprofen (ADVIL) tablet 400 mg (400 mg Oral Given 11/11/21 0621)    ED Course/ Medical Decision Making/ A&P           HEART Score: 1                Medical Decision Making  This patient presents to the ED for concern of chest pain, this involves an extensive number of treatment options, and is a complaint that carries with it a high risk of complications and morbidity.  The differential diagnosis includes acute coronary syndrome, pulmonary embolism, aortic dissection, pneumonia, pericarditis  Comorbidities that complicate the patient evaluation: Patients presentation is complicated by their history of obesity   Additional history obtained: Additional history obtained from spouse Records reviewed Primary Care Documents  Lab Tests: I Ordered, and personally interpreted labs.  The pertinent results include: No acute findings  Imaging Studies ordered: I ordered imaging studies including X-ray chest   I independently visualized and interpreted imaging which showed no acute findings I agree with the radiologist interpretation  Cardiac Monitoring: The patient was maintained on a cardiac monitor.  I personally viewed and interpreted the cardiac monitor which showed an underlying rhythm of:  sinus rhythm  Medicines ordered and prescription  drug management: I ordered medication including Vicodin and ibuprofen for pain  Patient is low risk / negative by heart score, therefore do not feel that admission or further work-up is indicated.  Complexity of problems addressed: Patients presentation is most consistent with  acute, uncomplicated illness  Disposition: After consideration of the diagnostic results and the patients response to treatment,  I feel that the patent would benefit from discharge patient presents with chest pain of unclear etiology.  She reports the pain occurs with any movement of her upper body or lifting her arms.  However there is no tenderness to palpation.  She denies any recent heavy lifting or trauma.  She is in no acute distress.  Her work-up in the Emergency Department is unremarkable.  I have low suspicion for ACS/PE/dissection at this time. .           Final Clinical Impression(s) / ED Diagnoses Final diagnoses:  Precordial pain    Rx / DC Orders ED Discharge Orders     None         Ripley Fraise, MD 11/11/21 [phone]

## 2022-05-04 ENCOUNTER — Ambulatory Visit: Payer: Self-pay | Admitting: Medical

## 2022-05-18 ENCOUNTER — Encounter: Payer: Self-pay | Admitting: Medical

## 2022-05-18 ENCOUNTER — Ambulatory Visit (INDEPENDENT_AMBULATORY_CARE_PROVIDER_SITE_OTHER): Payer: No Typology Code available for payment source | Admitting: Medical

## 2022-05-18 VITALS — BP 120/80 | HR 77 | Ht 69.0 in | Wt 278.2 lb

## 2022-05-18 DIAGNOSIS — L309 Dermatitis, unspecified: Secondary | ICD-10-CM

## 2022-05-18 DIAGNOSIS — R635 Abnormal weight gain: Secondary | ICD-10-CM | POA: Diagnosis not present

## 2022-05-18 DIAGNOSIS — E8881 Metabolic syndrome: Secondary | ICD-10-CM | POA: Diagnosis not present

## 2022-05-18 DIAGNOSIS — Z6841 Body Mass Index (BMI) 40.0 and over, adult: Secondary | ICD-10-CM | POA: Diagnosis not present

## 2022-05-18 DIAGNOSIS — E282 Polycystic ovarian syndrome: Secondary | ICD-10-CM | POA: Diagnosis not present

## 2022-05-18 DIAGNOSIS — E88819 Insulin resistance, unspecified: Secondary | ICD-10-CM | POA: Insufficient documentation

## 2022-05-18 MED ORDER — METFORMIN HCL 500 MG PO TABS: 500.0000 mg | ORAL_TABLET | Freq: Every day | ORAL | 0 refills | Status: DC

## 2022-05-18 NOTE — Addendum Note (Signed)
Addended by: Jac Canavan on: 05/18/2022 03:30 PM   Modules accepted: Orders

## 2022-05-18 NOTE — Patient Instructions (Signed)
Consider eating plan such as Weight Watchers or track calories on App on phone such as aiming for 1800 calories per day, or plan such as Whole 30 or other  Exercise 150 minutes minimum per week such as 5 days per week  Check insurance coverage for Wegovy, Qsymia, Saxenda    Sample meal planning  Breakfast You may eat 1 of the following Omelette, which can include a small amount of cheese, and vegetables such as peppers, mushrooms, small pieces of [place] or chicken Low sugar yogurt serving which can include some fruit such as berries Egg whites or hard boiled egg and meat (1-2 strips of bacon, or small piece of [place] sausage or [place] bacon)  Lunch A protein source such as 1 of the following: 1 serving of beans such as black beans, pinto beans, green beans, or edamame (soy beans) 1 meat serving such as 6 oz or deck of card size serving of fish, skinless chicken, or [place], either grilled or baked preferably.   You can use some pork or beef, but limit this compared to fish, chicken or [place] Vegetable - Half of your plate should be a non-starchy vegetables!  So avoid white potatoes and corn.  Otherwise, eat a large portion of vegetables. Avocado, cucumber, tomato, carrots, greens, lettuce, squash, okra, etc.  Vegetables can include salad with olive oil/vinaigrette dressing   Mid-afternoon snack 1 fruit serving such as one of the following: medium-sized apple medium-sized orange, Tangerine 1/2 banana  3/4 cup of fresh berries or frozen berries  A protein source such as one of the following: 8 almonds  small handful of walnuts or other nuts small piece of cheese, low sugar yogurt   Dinner A protein source such as 1 of the following: 1 serving of beans such as black beans, pinto beans, green beans, or edamame (soy beans) 1 meat serving such as 6 oz or deck of card size serving of fish, skinless chicken, or [place], either grilled or baked preferably.   You can use some pork or  beef, but limit this compared to fish, chicken or [place] Vegetable - Half of your plate should be a non-starchy vegetables!  So avoid white potatoes and corn.  Otherwise, eat a large portion of vegetables. Avocado, cucumber, tomato, carrots, greens, lettuce, squash, okra, etc.  Vegetables can include salad with olive oil/vinaigrette dressing   Beverages: Water Unsweet tea Home made juice with a juicer without sugar added other than small bit of honey or agave nectar Water with sugar free flavor such as [place]   AVOID.... For the time being I want you to cut out the following items completely: Soda, sweet tea, juice, beer or wine or alcohol ALL grains and breads including rice, pasta, bread, cereal Sweets such as cake, candy, pies, chips, cookies, chocolate

## 2022-05-18 NOTE — Progress Notes (Signed)
Subjective:  [name] is a 31 y.o. female who presents for Chief Complaint  Patient presents with   Acute Visit    New pt get est. She has weight loss concerns, she has also had a lot of rashes lately.     Here for new patient to establish care.  Was seeing [facility] prior but due to insurance changes, had to change providers.   Her main concern today is having trouble losing weight.   She does have hx/o PCOS.  Was on Metformin in the past but it didn't agree with her.  Was having diarhea and GI upset with metformin.  Even at once daily dosing was having bowel issues.   Exercises 3-4 days per week, some weeks 5 days per week.  Works 2 jobs.  Feels like she eats relatively healthy.  Doesn't seem to be making progress with efforts  She did see weight management clinic with [facility] through her employer.   No medications were used given PCOS and insulin resistance.  Recently having some rashes, face, chest.  New rashes x few days.  Face seems to break out.  Thought it was related to soap but changed soaps.  Using benadryl with some relief.     No hx/o eczema or dermatitis.    No other aggravating or relieving factors.    No other c/o.  Past Medical History:  Diagnosis Date   Kikuchi disease    Sinusitis    No current outpatient medications on file prior to visit.   No current facility-administered medications on file prior to visit.     The following portions of the patient's history were reviewed and updated as appropriate: allergies, current medications, past family history, past medical history, past social history, past surgical history and problem list.  ROS Otherwise as in subjective above  Objective: BP 120/80   Pulse 77   Ht 5\' 9"  (1.753 m)   Wt 278 lb 3.2 oz (126.2 kg)   LMP 03/30/2022   SpO2 99%   BMI 41.08 kg/m   General appearance: alert, no distress, well developed, well nourished, African American female Skin: There is a few maculopapular small  2 mm or less lesions of the face suggestive of skin sensitivity without erythema.  No excoriations.  No other rash on rest of body. HEENT: normocephalic, sclerae anicteric, conjunctiva pink and moist, TMs pearly, nares patent, no discharge or erythema, pharynx normal Oral cavity: Dry MM, no lesions Neck: supple, no lymphadenopathy, no thyromegaly, no masses Heart: RRR, normal S1, S2, no murmurs Lungs: CTA bilaterally, no wheezes, rhonchi, or rales Abdomen: +bs, soft, non tender, non distended, no masses, no hepatomegaly, no splenomegaly Pulses: 2+ radial pulses, 2+ pedal pulses, normal cap refill Ext: no edema   Assessment: Encounter Diagnoses  Name Primary?   Weight gain Yes   BMI 40.0-44.9, adult (HCC)    PCOS (polycystic ovarian syndrome)    Insulin resistance    Dermatitis      Plan: We discussed her medical history.  We discussed strategies to help her weight loss.  We discussed limitations including the PCOS and insulin resistance.  I reviewed labs from 2022 in the chart record.  Prior hemoglobin A1c in 2020 was 5.6%, prior glucose 95 fasting, normal electrolytes kidney and liver markers.  Normal thyroid 2022.  Insulin was elevated at 35.  We discussed various eating strategies such as weight watchers program, low calorie diet, low carb diet, or other.  Counseled on exercise with 150  minutes/week of exercise.  Discussed the need for significant increase in water intake  We discussed possible weight loss medications.  She will check insurance coverage and will consider  She will begin back on trial of metformin to see if she tolerates low-dose once daily regarding PCOS and insulin resistance  Dermatitis-likely topical reaction to some recent hygiene product, sensitivity issue.  Avoid recent facial washes or make-up.  Use doses of skin soap and good hygiene the next few days.  Can use Benadryl for the next few days.  I suspect this will resolve quickly    Marvalene was seen today  for acute visit.  Diagnoses and all orders for this visit:  Weight gain  BMI 40.0-44.9, adult (HCC)  PCOS (polycystic ovarian syndrome)  Insulin resistance  Dermatitis    Follow up: 6-10 weeks for fasting physical

## 2022-05-22 ENCOUNTER — Other Ambulatory Visit: Payer: Self-pay | Admitting: Medical

## 2022-05-27 MED ORDER — METFORMIN HCL 500 MG PO TABS: 500.0000 mg | ORAL_TABLET | Freq: Every day | ORAL | 1 refills | Status: DC

## 2022-05-28 ENCOUNTER — Telehealth: Payer: Self-pay | Admitting: Medical

## 2022-05-28 MED ORDER — METFORMIN HCL 500 MG PO TABS: 500.0000 mg | ORAL_TABLET | Freq: Every day | ORAL | 1 refills | Status: DC

## 2022-05-28 NOTE — Telephone Encounter (Signed)
Recv'd fax from [facility] req refill Metformin

## 2022-06-10 NOTE — Progress Notes (Deleted)
   [name] 02-06-1991 [id number]   History:  31 y.o. G0 presents as new patient to establish care. H/O PCOS. Restarted on Metformin a couple of weeks ago, prescribed by PCP. Is having trouble with weight loss despite exercise. She has discussed with PCP, insurance not covering weight loss medications.   Gynecologic History Patient's last menstrual period was 03/30/2022.   Contraception/Family planning: {method:5051} Sexually active: ***  Health Maintenance Last Pap: 08/06/2017. Results were: Normal Last mammogram: Not indicated Last colonoscopy: Not indicated Last Dexa: Not indicated  Past medical history, past surgical history, family history and social history were all reviewed and documented in the EPIC chart.  ROS:  A ROS was performed and pertinent positives and negatives are included.  Exam:  There were no vitals filed for this visit. There is no height or weight on file to calculate BMI.  General appearance:  Normal Thyroid:  Symmetrical, normal in size, without palpable masses or nodularity. Respiratory  Auscultation:  Clear without wheezing or rhonchi Cardiovascular  Auscultation:  Regular rate, without rubs, murmurs or gallops  Edema/varicosities:  Not grossly evident Abdominal  Soft,nontender, without masses, guarding or rebound.  Liver/spleen:  No organomegaly noted  Hernia:  None appreciated  Skin  Inspection:  Grossly normal Breasts: Examined lying and sitting.   Right: Without masses, retractions, nipple discharge or axillary adenopathy.   Left: Without masses, retractions, nipple discharge or axillary adenopathy. Genitourinary   Inguinal/mons:  Normal without inguinal adenopathy  External genitalia:  Normal appearing vulva with no masses, tenderness, or lesions  BUS/Urethra/Skene's glands:  Normal  Vagina:  Normal appearing with normal color and discharge, no lesions  Cervix:  Normal appearing without discharge or lesions  Uterus:  Normal in size,  shape and contour.  Midline and mobile, nontender  Adnexa/parametria:     Rt: Normal in size, without masses or tenderness.   Lt: Normal in size, without masses or tenderness.  Anus and perineum: Normal  Digital rectal exam: Not indicated  Patient informed chaperone available to be present for breast and pelvic exam. Patient has requested no chaperone to be present. Patient has been advised what will be completed during breast and pelvic exam.   Assessment/Plan:  31 y.o. G0 to establish care.    Return in 1 year for annual.   Olivia Mackie DNP, 2:14 PM 06/10/2022

## 2022-06-11 ENCOUNTER — Encounter: Payer: No Typology Code available for payment source | Admitting: Nurse Practitioner

## 2022-06-11 DIAGNOSIS — Z01419 Encounter for gynecological examination (general) (routine) without abnormal findings: Secondary | ICD-10-CM

## 2022-06-11 DIAGNOSIS — E282 Polycystic ovarian syndrome: Secondary | ICD-10-CM

## 2022-06-30 ENCOUNTER — Ambulatory Visit (INDEPENDENT_AMBULATORY_CARE_PROVIDER_SITE_OTHER): Payer: No Typology Code available for payment source | Admitting: Medical

## 2022-06-30 ENCOUNTER — Encounter: Payer: Self-pay | Admitting: Medical

## 2022-06-30 VITALS — BP 122/80 | HR 96 | Ht 69.5 in | Wt 278.2 lb

## 2022-06-30 DIAGNOSIS — R079 Chest pain, unspecified: Secondary | ICD-10-CM | POA: Insufficient documentation

## 2022-06-30 DIAGNOSIS — Z131 Encounter for screening for diabetes mellitus: Secondary | ICD-10-CM | POA: Insufficient documentation

## 2022-06-30 DIAGNOSIS — E8881 Metabolic syndrome: Secondary | ICD-10-CM | POA: Diagnosis not present

## 2022-06-30 DIAGNOSIS — E282 Polycystic ovarian syndrome: Secondary | ICD-10-CM | POA: Diagnosis not present

## 2022-06-30 DIAGNOSIS — Z13220 Encounter for screening for lipoid disorders: Secondary | ICD-10-CM

## 2022-06-30 DIAGNOSIS — Z6841 Body Mass Index (BMI) 40.0 and over, adult: Secondary | ICD-10-CM | POA: Diagnosis not present

## 2022-06-30 DIAGNOSIS — Z0000 Encounter for general adult medical examination without abnormal findings: Secondary | ICD-10-CM | POA: Diagnosis not present

## 2022-06-30 DIAGNOSIS — Z1159 Encounter for screening for other viral diseases: Secondary | ICD-10-CM | POA: Insufficient documentation

## 2022-06-30 DIAGNOSIS — Z113 Encounter for screening for infections with a predominantly sexual mode of transmission: Secondary | ICD-10-CM | POA: Insufficient documentation

## 2022-06-30 LAB — POCT URINALYSIS DIP (PROADVANTAGE DEVICE)
Bilirubin, UA: NEGATIVE
Blood, UA: NEGATIVE
Glucose, UA: NEGATIVE mg/dL
Ketones, POC UA: NEGATIVE mg/dL
Nitrite, UA: NEGATIVE
Protein Ur, POC: NEGATIVE mg/dL
Specific Gravity, Urine: 1.03
Urobilinogen, Ur: NEGATIVE
pH, UA: 6 (ref 5.0–8.0)

## 2022-06-30 MED ORDER — OMEPRAZOLE 40 MG PO CPDR: 40.0000 mg | DELAYED_RELEASE_CAPSULE | Freq: Every day | ORAL | 0 refills | Status: DC

## 2022-06-30 MED ORDER — TIZANIDINE HCL 4 MG PO TABS: 4.0000 mg | ORAL_TABLET | Freq: Four times a day (QID) | ORAL | 0 refills | Status: DC | PRN

## 2022-06-30 NOTE — Progress Notes (Signed)
Subjective:   HPI  [name] is a 31 y.o. female who presents for Chief Complaint  Patient presents with   fasting physical    Physical. Had labs done this morning. Having slight chest pains- on going for over a year- having to take ibuprofen.went to ER being of year for it and was told it was from working out and they gave muscle relaxers for it. Obgyn- Victorino Dike- [facility],- care everywhere- last seen 2022,  declines flu shot    Patient Care Team: Tarea Skillman, Cleda Mccreedy as PCP - General (Family Medicine) Sees dentist Sees eye doctor Deboraha Sprang [facility]   Concerns: Here for physical, fasting  Has intermittent chest pains.  Has been prescribed muscle relaxers that help some, but no specific cause found . Denies abdominal pain, belching, SOB, palpations.     No concern for STD but wants screening and recheck on diabetes screen and insulin levels.     Reviewed their medical, surgical, family, social, medication, and allergy history and updated chart as appropriate.  Past Medical History:  Diagnosis Date   Kikuchi disease    PCOS (polycystic ovarian syndrome) 2022   Sinusitis     Family History  Problem Relation Age of Onset   Cancer Mother        cervical   Diabetes Mother    Hypertension Father    Lupus Sister    Cancer Brother        bone   Cancer Paternal Aunt        breast   Heart disease Maternal Grandmother    Heart failure Other      Current Outpatient Medications:    metFORMIN (GLUCOPHAGE) 500 MG tablet, Take 1 tablet (500 mg total) by mouth daily with breakfast., Disp: 90 tablet, Rfl: 1   omeprazole (PRILOSEC) 40 MG capsule, Take 1 capsule (40 mg total) by mouth daily., Disp: 30 capsule, Rfl: 0  No Known Allergies    Review of Systems Constitutional: -fever, -chills, -sweats, -unexpected weight change, -decreased appetite, -fatigue Allergy: -sneezing, -itching, -congestion Dermatology: -changing moles, --rash, -lumps ENT: -runny nose, -ear pain,  -sore throat, -hoarseness, -sinus pain, -teeth pain, - ringing in ears, -hearing loss, -nosebleeds Cardiology: +chest pain, -palpitations, -swelling, -difficulty breathing when lying flat, -waking up short of breath Respiratory: -cough, -shortness of breath, -difficulty breathing with exercise or exertion, -wheezing, -coughing up blood Gastroenterology: -abdominal pain, -nausea, -vomiting, -diarrhea, -constipation, -blood in stool, -changes in bowel movement, -difficulty swallowing or eating Hematology: -bleeding, -bruising  Musculoskeletal: -joint aches, -muscle aches, -joint swelling, -back pain, -neck pain, -cramping, -changes in gait Ophthalmology: denies vision changes, eye redness, itching, discharge Urology: -burning with urination, -difficulty urinating, -blood in urine, -urinary frequency, -urgency, -incontinence Neurology: -headache, -weakness, -tingling, -numbness, -memory loss, -falls, -dizziness Psychology: -depressed mood, -agitation, -sleep problems Breast/gyn: -breast tendnerss, -discharge, -lumps, -vaginal discharge,- irregular periods, -heavy periods      06/30/2022    2:14 PM 05/18/2022   11:40 AM  Depression screen PHQ 2/9  Decreased Interest 0 0  Down, Depressed, Hopeless 0 0  PHQ - 2 Score 0 0       Objective:  BP 122/80   Pulse 96   Ht 5' 9.5" (1.765 m)   Wt 278 lb 3.2 oz (126.2 kg)   BMI 40.49 kg/m   General appearance: alert, no distress, WD/WN, African American female Skin: few skin tags of bilat face and lower eyelids left and right, tattoo left forearm, HEENT: normocephalic, conjunctiva/corneas normal, sclerae anicteric, PERRLA, EOMi,  nares patent, no discharge or erythema, pharynx normal Oral cavity: MMM, tongue normal, teeth normal Neck: supple, no lymphadenopathy, mild general thyromegaly, no masses, normal ROM, no bruits Chest: non tender, normal shape and expansion Heart: RRR, normal S1, S2, no murmurs Lungs: CTA bilaterally, no wheezes, rhonchi,  or rales Abdomen: +bs, soft, non tender, non distended, no masses, no hepatomegaly, no splenomegaly, no bruits Back: non tender, normal ROM, no scoliosis Musculoskeletal: upper extremities non tender, no obvious deformity, normal ROM throughout, lower extremities non tender, no obvious deformity, normal ROM throughout Extremities: no edema, no cyanosis, no clubbing Pulses: 2+ symmetric, upper and lower extremities, normal cap refill Neurological: alert, oriented x 3, CN2-12 intact, strength normal upper extremities and lower extremities, sensation normal throughout, DTRs 2+ throughout, no cerebellar signs, gait normal Psychiatric: normal affect, behavior normal, pleasant  Breast/gyn/rectal - deferred to gynecology     Assessment and Plan :   Encounter Diagnoses  Name Primary?   Encounter for health maintenance examination in adult Yes   PCOS (polycystic ovarian syndrome)    Insulin resistance    BMI 40.0-44.9, adult (HCC)    Screening for lipid disorders    Screening for diabetes mellitus    Screen for STD (sexually transmitted disease)    Encounter for hepatitis C screening test for low risk patient    Chest pain, unspecified type      This visit was a preventative care visit, also known as wellness visit or routine physical.   Topics typically include healthy lifestyle, diet, exercise, preventative care, vaccinations, sick and well care, proper use of emergency dept and after hours care, as well as other concerns.     Recommendations: Continue to return yearly for your annual wellness and preventative care visits.  This gives [place] a chance to discuss healthy lifestyle, exercise, vaccinations, review your chart record, and perform screenings where appropriate.  I recommend you see your eye doctor yearly for routine vision care.  I recommend you see your dentist yearly for routine dental care including hygiene visits twice yearly.  See your gynecologist yearly for routine  gynecological care.    Vaccination recommendations were reviewed    Screening for cancer: Colon cancer screening: Age 38  Breast cancer screening: You should perform a self breast exam monthly.   We reviewed recommendations for regular mammograms and breast cancer screening.  Cervical cancer screening: We reviewed recommendations for pap smear screening.   Skin cancer screening: Check your skin regularly for new changes, growing lesions, or other lesions of concern Come in for evaluation if you have skin lesions of concern.  Lung cancer screening: If you have a greater than 20 pack year history of tobacco use, then you may qualify for lung cancer screening with a chest CT scan.   Please call your insurance company to inquire about coverage for this test.  We currently don't have screenings for other cancers besides breast, cervical, colon, and lung cancers.  If you have a strong family history of cancer or have other cancer screening concerns, please let me know.    Bone health: Get at least 150 minutes of aerobic exercise weekly Get weight bearing exercise at least once weekly Bone density test:  A bone density test is an imaging test that uses a type of X-ray to measure the amount of calcium and other minerals in your bones. The test may be used to diagnose or screen you for a condition that causes weak or thin bones (osteoporosis), predict your risk  for a broken bone (fracture), or determine how well your osteoporosis treatment is working. The bone density test is recommended for females 77 and older, or females or males [id number] if certain risk factors such as thyroid disease, long term use of steroids such as for asthma or rheumatological issues, vitamin D deficiency, estrogen deficiency, family history of osteoporosis, self or family history of fragility fracture in first degree relative.    Heart health: Get at least 150 minutes of aerobic exercise weekly Limit alcohol It  is important to maintain a healthy blood pressure and healthy cholesterol numbers  Heart disease screening: Screening for heart disease includes screening for blood pressure, fasting lipids, glucose/diabetes screening, BMI height to weight ratio, reviewed of smoking status, physical activity, and diet.    Goals include blood pressure 120/80 or less, maintaining a healthy lipid/cholesterol profile, preventing diabetes or keeping diabetes numbers under good control, not smoking or using tobacco products, exercising most days per week or at least 150 minutes per week of exercise, and eating healthy variety of fruits and vegetables, healthy oils, and avoiding unhealthy food choices like fried food, fast food, high sugar and high cholesterol foods.    Other tests may possibly include EKG test, CT coronary calcium score, echocardiogram, exercise treadmill stress test.   Reviewed 11/2021 EKG on file, normal   Medical care options: I recommend you continue to seek care here first for routine care.  We try really hard to have available appointments Monday through Friday daytime hours for sick visits, acute visits, and physicals.  Urgent care should be used for after hours and weekends for significant issues that cannot wait till the next day.  The emergency department should be used for significant potentially life-threatening emergencies.  The emergency department is expensive, can often have long wait times for less significant concerns, so try to utilize primary care, urgent care, or telemedicine when possible to avoid unnecessary trips to the emergency department.  Virtual visits and telemedicine have been introduced since the pandemic started in 2020, and can be convenient ways to receive medical care.  We offer virtual appointments as well to assist you in a variety of options to seek medical care.   Advanced Directives: I recommend you consider completing a [place] and Living  Will.   These documents respect your wishes and help alleviate burdens on your loved ones if you were to become terminally ill or be in a position to need those documents enforced.    You can complete Advanced Directives yourself, have them notarized, then have copies made for our office, for you and for anybody you feel should have them in safe keeping.  Or, you can have an attorney prepare these documents.   If you haven't updated your Last Will and Testament in a while, it may be worthwhile having an attorney prepare these documents together and save on some costs.       Separate significant issues discussed: PCOS - on metformin, seeing gynecology  Update labs today  Continue efforts to lose weight thorough healthy diet and exercise  Chest pain - begin trial of Omeprazole daily in the morning . Avoid spicy and acidic foods.    F/u pending labs  Skin tags - discussed skin tags of face, possible options for therapy, cryotherapy, excision   [place] was seen today for fasting physical.  Diagnoses and all orders for this visit:  Encounter for health maintenance examination in adult -     Comprehensive  metabolic panel -     Hemoglobin A1c -     Insulin, random -     TSH -     CBC -     Lipid panel -     RPR+HIV+GC+CT Panel -     Hepatitis C antibody -     Hepatitis B surface antigen  PCOS (polycystic ovarian syndrome) -     Insulin, random  Insulin resistance  BMI 40.0-44.9, adult (HCC)  Screening for lipid disorders -     Lipid panel  Screening for diabetes mellitus  Screen for STD (sexually transmitted disease) -     Hemoglobin A1c -     RPR+HIV+GC+CT Panel -     Hepatitis C antibody -     Hepatitis B surface antigen  Encounter for hepatitis C screening test for low risk patient -     Hepatitis C antibody  Chest pain, unspecified type  Other orders -     omeprazole (PRILOSEC) 40 MG capsule; Take 1 capsule (40 mg total) by mouth daily.    Follow-up pending  labs, yearly for physical

## 2022-06-30 NOTE — Addendum Note (Signed)
Addended by: Herminio Commons A on: 06/30/2022 03:14 PM   Modules accepted: Orders

## 2022-06-30 NOTE — Patient Instructions (Signed)
This visit was a preventative care visit, also known as wellness visit or routine physical.   Topics typically include healthy lifestyle, diet, exercise, preventative care, vaccinations, sick and well care, proper use of emergency dept and after hours care, as well as other concerns.     Recommendations: Continue to return yearly for your annual wellness and preventative care visits.  This gives [place] a chance to discuss healthy lifestyle, exercise, vaccinations, review your chart record, and perform screenings where appropriate.  I recommend you see your eye doctor yearly for routine vision care.  I recommend you see your dentist yearly for routine dental care including hygiene visits twice yearly.  See your gynecologist yearly for routine gynecological care.    Vaccination recommendations were reviewed    Screening for cancer: Colon cancer screening: Age 50  Breast cancer screening: You should perform a self breast exam monthly.   We reviewed recommendations for regular mammograms and breast cancer screening.  Cervical cancer screening: We reviewed recommendations for pap smear screening.   Skin cancer screening: Check your skin regularly for new changes, growing lesions, or other lesions of concern Come in for evaluation if you have skin lesions of concern.  Lung cancer screening: If you have a greater than 20 pack year history of tobacco use, then you may qualify for lung cancer screening with a chest CT scan.   Please call your insurance company to inquire about coverage for this test.  We currently don't have screenings for other cancers besides breast, cervical, colon, and lung cancers.  If you have a strong family history of cancer or have other cancer screening concerns, please let me know.    Bone health: Get at least 150 minutes of aerobic exercise weekly Get weight bearing exercise at least once weekly Bone density test:  A bone density test is an imaging test that  uses a type of X-ray to measure the amount of calcium and other minerals in your bones. The test may be used to diagnose or screen you for a condition that causes weak or thin bones (osteoporosis), predict your risk for a broken bone (fracture), or determine how well your osteoporosis treatment is working. The bone density test is recommended for females 65 and older, or females or males <65 if certain risk factors such as thyroid disease, long term use of steroids such as for asthma or rheumatological issues, vitamin D deficiency, estrogen deficiency, family history of osteoporosis, self or family history of fragility fracture in first degree relative.    Heart health: Get at least 150 minutes of aerobic exercise weekly Limit alcohol It is important to maintain a healthy blood pressure and healthy cholesterol numbers  Heart disease screening: Screening for heart disease includes screening for blood pressure, fasting lipids, glucose/diabetes screening, BMI height to weight ratio, reviewed of smoking status, physical activity, and diet.    Goals include blood pressure 120/80 or less, maintaining a healthy lipid/cholesterol profile, preventing diabetes or keeping diabetes numbers under good control, not smoking or using tobacco products, exercising most days per week or at least 150 minutes per week of exercise, and eating healthy variety of fruits and vegetables, healthy oils, and avoiding unhealthy food choices like fried food, fast food, high sugar and high cholesterol foods.    Other tests may possibly include EKG test, CT coronary calcium score, echocardiogram, exercise treadmill stress test.   Reviewed 11/2021 EKG on file, normal   Medical care options: I recommend you continue to seek care here  first for routine care.  We try really hard to have available appointments Monday through Friday daytime hours for sick visits, acute visits, and physicals.  Urgent care should be used for after  hours and weekends for significant issues that cannot wait till the next day.  The emergency department should be used for significant potentially life-threatening emergencies.  The emergency department is expensive, can often have long wait times for less significant concerns, so try to utilize primary care, urgent care, or telemedicine when possible to avoid unnecessary trips to the emergency department.  Virtual visits and telemedicine have been introduced since the pandemic started in 2020, and can be convenient ways to receive medical care.  We offer virtual appointments as well to assist you in a variety of options to seek medical care.   Advanced Directives: I recommend you consider completing a Health Care Power of Attorney and Living Will.   These documents respect your wishes and help alleviate burdens on your loved ones if you were to become terminally ill or be in a position to need those documents enforced.    You can complete Advanced Directives yourself, have them notarized, then have copies made for our office, for you and for anybody you feel should have them in safe keeping.  Or, you can have an attorney prepare these documents.   If you haven't updated your Last Will and Testament in a while, it may be worthwhile having an attorney prepare these documents together and save on some costs.       Separate significant issues discussed: PCOS - on metformin, seeing gynecology  Update labs today  Continue efforts to lose weight thorough healthy diet and exercise  Chest pain - begin trial of Omeprazole daily in the morning . Avoid spicy and acidic foods.    F/u pending labs  Skin tags - discussed skin tags of face, possible options for therapy, cryotherapy, excision

## 2022-07-02 LAB — INSULIN, RANDOM: INSULIN: 45 u[IU]/mL — ABNORMAL HIGH (ref 2.6–24.9)

## 2022-07-02 LAB — COMPREHENSIVE METABOLIC PANEL WITH GFR
ALT: 28 [IU]/L (ref 0–32)
AST: 26 [IU]/L (ref 0–40)
Albumin/Globulin Ratio: 1.2 (ref 1.2–2.2)
Albumin: 4.2 g/dL (ref 3.9–4.9)
Alkaline Phosphatase: 44 [IU]/L (ref 44–121)
BUN/Creatinine Ratio: 9 (ref 9–23)
BUN: 7 mg/dL (ref 6–20)
Bilirubin Total: 0.4 mg/dL (ref 0.0–1.2)
CO2: 23 mmol/L (ref 20–29)
Calcium: 9.3 mg/dL (ref 8.7–10.2)
Chloride: 106 mmol/L (ref 96–106)
Creatinine, Ser: 0.74 mg/dL (ref 0.57–1.00)
Globulin, Total: 3.4 g/dL (ref 1.5–4.5)
Glucose: 81 mg/dL (ref 70–99)
Potassium: 4.2 mmol/L (ref 3.5–5.2)
Sodium: 142 mmol/L (ref 134–144)
Total Protein: 7.6 g/dL (ref 6.0–8.5)
eGFR: 111 mL/min/{1.73_m2}

## 2022-07-02 LAB — CBC
Hematocrit: 40.5 % (ref 34.0–46.6)
Hemoglobin: 13 g/dL (ref 11.1–15.9)
MCH: 26.4 pg — ABNORMAL LOW (ref 26.6–33.0)
MCHC: 32.1 g/dL (ref 31.5–35.7)
MCV: 82 fL (ref 79–97)
Platelets: 239 10*3/uL (ref 150–450)
RBC: 4.92 x10E6/uL (ref 3.77–5.28)
RDW: 14.6 % (ref 11.7–15.4)
WBC: 4.6 10*3/uL (ref 3.4–10.8)

## 2022-07-02 LAB — HEPATITIS C ANTIBODY: Hep C Virus Ab: NONREACTIVE

## 2022-07-02 LAB — LIPID PANEL
Chol/HDL Ratio: 4.7 ratio — ABNORMAL HIGH (ref 0.0–4.4)
Cholesterol, Total: 161 mg/dL (ref 100–199)
HDL: 34 mg/dL — ABNORMAL LOW (ref >39)
LDL Chol Calc (NIH): 104 mg/dL — ABNORMAL HIGH (ref 0–99)
Triglycerides: 125 mg/dL (ref 0–149)
VLDL Cholesterol Cal: 23 mg/dL (ref 5–40)

## 2022-07-02 LAB — HEMOGLOBIN A1C
Est. average glucose Bld gHb Est-mCnc: 114 mg/dL
Hgb A1c MFr Bld: 5.6 % (ref 4.8–5.6)

## 2022-07-02 LAB — RPR+HIV+GC+CT PANEL
Chlamydia trachomatis, NAA: NEGATIVE
HIV Screen 4th Generation wRfx: NONREACTIVE
Neisseria Gonorrhoeae by PCR: NEGATIVE
RPR Ser Ql: NONREACTIVE

## 2022-07-02 LAB — HEPATITIS B SURFACE ANTIGEN: Hepatitis B Surface Ag: NEGATIVE

## 2022-07-02 LAB — TSH: TSH: 0.962 u[IU]/mL (ref 0.450–4.500)

## 2022-07-03 ENCOUNTER — Other Ambulatory Visit: Payer: Self-pay | Admitting: Medical

## 2022-07-03 MED ORDER — OMEPRAZOLE 40 MG PO CPDR: 40.0000 mg | DELAYED_RELEASE_CAPSULE | Freq: Every day | ORAL | 0 refills | Status: DC

## 2022-07-03 MED ORDER — WEGOVY 0.25 MG/0.5ML ~~LOC~~ SOAJ: 0.2500 mg | SUBCUTANEOUS | 0 refills | Status: DC

## 2022-07-06 ENCOUNTER — Other Ambulatory Visit: Payer: Self-pay | Admitting: Medical

## 2022-07-06 MED ORDER — WEGOVY 0.25 MG/0.5ML ~~LOC~~ SOAJ
0.2500 mg | SUBCUTANEOUS | 0 refills | Status: DC
  Filled 2022-07-06: qty 2, 28d supply, fill #0

## 2022-07-07 ENCOUNTER — Other Ambulatory Visit (HOSPITAL_COMMUNITY): Payer: Self-pay

## 2022-07-31 ENCOUNTER — Ambulatory Visit: Payer: No Typology Code available for payment source | Admitting: Medical

## 2022-08-03 ENCOUNTER — Encounter: Payer: Self-pay | Admitting: Medical

## 2022-08-18 ENCOUNTER — Encounter: Payer: Self-pay | Admitting: Internal Medicine

## 2022-09-07 DIAGNOSIS — Z7189 Other specified counseling: Secondary | ICD-10-CM

## 2022-09-10 ENCOUNTER — Institutional Professional Consult (permissible substitution): Payer: No Typology Code available for payment source | Admitting: Medical

## 2022-10-05 ENCOUNTER — Other Ambulatory Visit: Payer: Self-pay | Admitting: Internal Medicine

## 2022-10-05 MED ORDER — OMEPRAZOLE 40 MG PO CPDR: 40.0000 mg | DELAYED_RELEASE_CAPSULE | Freq: Every day | ORAL | 1 refills | Status: AC

## 2022-10-05 NOTE — Telephone Encounter (Signed)
From: Juliane Lack To: Office of Kristian Covey, [place] Sent: 10/04/2022 1:06 PM EST Subject: Medication Renewal Request  Refills have been requested for the following medications:   omeprazole (PRILOSEC) 40 MG capsule [Shane Tysinger]  Preferred pharmacy: [id number] [place], [place] - [address] Delivery method: [employer]

## 2022-10-05 NOTE — Telephone Encounter (Signed)
refilled 

## 2022-10-28 ENCOUNTER — Other Ambulatory Visit: Payer: Self-pay | Admitting: Medical

## 2022-12-07 ENCOUNTER — Encounter (HOSPITAL_BASED_OUTPATIENT_CLINIC_OR_DEPARTMENT_OTHER): Payer: Self-pay | Admitting: Emergency Medicine

## 2022-12-07 ENCOUNTER — Emergency Department (HOSPITAL_BASED_OUTPATIENT_CLINIC_OR_DEPARTMENT_OTHER)
Admission: EM | Admit: 2022-12-07 | Discharge: 2022-12-07 | Disposition: A | Discharge status: Home / Self Care | Payer: 59 | Attending: Emergency Medicine | Admitting: Emergency Medicine

## 2022-12-07 ENCOUNTER — Other Ambulatory Visit: Payer: Self-pay

## 2022-12-07 DIAGNOSIS — R059 Cough, unspecified: Secondary | ICD-10-CM | POA: Diagnosis present

## 2022-12-07 DIAGNOSIS — U071 COVID-19: Secondary | ICD-10-CM | POA: Insufficient documentation

## 2022-12-07 LAB — RESP PANEL BY RT-PCR (RSV, FLU A&B, COVID)  RVPGX2
Influenza A by PCR: NEGATIVE
Influenza B by PCR: NEGATIVE
Resp Syncytial Virus by PCR: NEGATIVE
SARS Coronavirus 2 by RT PCR: POSITIVE — AB

## 2022-12-07 NOTE — ED Provider Notes (Signed)
[facility] Provider Note   CSN: [id number] Arrival date & time: 12/07/22  2001     History  Chief Complaint  Patient presents with   Cough    [name] is a 32 y.o. female.  Pt complains of a cough and congestion.  Pt reports she began feeling bad yesterday.    The history is provided by the patient. No language interpreter was used.  Cough Cough characteristics:  Non-productive Sputum characteristics:  Nondescript Severity:  Moderate Onset quality:  Gradual Duration:  1 day Timing:  Constant Progression:  Worsening Chronicity:  New Smoker: no   Relieved by:  Nothing Worsened by:  Nothing Ineffective treatments:  None tried Associated symptoms: fever        Home Medications Prior to Admission medications   Medication Sig Start Date End Date Taking? Authorizing Provider  omeprazole (PRILOSEC) 40 MG capsule Take 1 capsule (40 mg total) by mouth daily. 10/05/22   Tysinger, Camelia Eng, PA-C  metFORMIN (GLUCOPHAGE) 500 MG tablet TAKE 1 TABLET BY MOUTH DAILY  WITH BREAKFAST 10/29/22   Tysinger, Camelia Eng, PA-C  Semaglutide-Weight Management (WEGOVY) 0.25 MG/0.5ML SOAJ Inject 0.25 mg into the skin once a week. 07/06/22   Tysinger, Camelia Eng, PA-C  tiZANidine (ZANAFLEX) 4 MG tablet Take 1 tablet (4 mg total) by mouth every 6 (six) hours as needed for muscle spasms. 06/30/22   Tysinger, Camelia Eng, PA-C      Allergies    Patient has no known allergies.    Review of Systems   Review of Systems  Constitutional:  Positive for fever.  Respiratory:  Positive for cough.   All other systems reviewed and are negative.   Physical Exam Updated Vital Signs BP 123/84 (BP Location: Right Arm)   Pulse (!) 117   Temp 98.4 F (36.9 C) (Oral)   Resp 18   LMP  (LMP Unknown)   SpO2 99%  Physical Exam Vitals and nursing note reviewed.  Constitutional:      Appearance: She is well-developed.  HENT:     Head: Normocephalic.     Right Ear:  Tympanic membrane normal.     Left Ear: Tympanic membrane normal.     Mouth/Throat:     Mouth: Mucous membranes are moist.  Cardiovascular:     Rate and Rhythm: Normal rate.  Pulmonary:     Effort: Pulmonary effort is normal.  Abdominal:     General: Abdomen is flat. There is no distension.  Musculoskeletal:        General: Normal range of motion.     Cervical back: Normal range of motion.  Skin:    General: Skin is warm.  Neurological:     General: No focal deficit present.     Mental Status: She is alert and oriented to person, place, and time.     ED Results / Procedures / Treatments   Labs (all labs ordered are listed, but only abnormal results are displayed) Labs Reviewed  RESP PANEL BY RT-PCR (RSV, FLU A&B, COVID)  RVPGX2 - Abnormal; Notable for the following components:      Result Value   SARS Coronavirus 2 by RT PCR POSITIVE (*)    All other components within normal limits    EKG None  Radiology No results found.  Procedures Procedures    Medications Ordered in ED Medications - No data to display  ED Course/ Medical Decision Making/ A&P  Medical Decision Making Pt complains of a cough and congestion    Amount and/or Complexity of Data Reviewed Labs: ordered. Decision-making details documented in ED Course.    Details: Labs ordered reviewed and interpreted.  Covid is positive   Risk OTC drugs. Risk Details: Pt counseled on symptomatic treatment.             Final Clinical Impression(s) / ED Diagnoses Final diagnoses:  COVID    Rx / DC Orders ED Discharge Orders     None     An After Visit Summary was printed and given to the patient.     Fransico Meadow, Hershal Coria 12/07/22 2152    Lennice Sites, DO 12/07/22 2229

## 2022-12-07 NOTE — ED Triage Notes (Signed)
Cough fever body aches. Started last night

## 2022-12-08 ENCOUNTER — Telehealth: Payer: Self-pay | Admitting: Medical

## 2022-12-08 NOTE — Telephone Encounter (Signed)
Transition Care Management Unsuccessful Follow-up Telephone Call  Date of discharge and from where:  12/07/2022 [facility]  Attempts:  1st Attempt  Reason for unsuccessful TCM follow-up call:  Left voice message call #1

## 2022-12-09 ENCOUNTER — Telehealth: Payer: Self-pay | Admitting: Medical

## 2022-12-09 NOTE — Telephone Encounter (Signed)
Transition Care Management Follow-up Telephone Call Date of discharge and from where: 12/07/2022 [facility] How have you been since you were released from the hospital? A litter better no longer running a fever Any questions or concerns? No  Items Reviewed: Did the pt receive and understand the discharge instructions provided? Yes  Medications obtained and verified? Yes pt was not given any medications but has been taken OTC Tylenol. She did ask for recommendation for OTC cough medications. I asked Adam and he states that normal recommendations are Robutussin DM and delsym. Information was relayed to patient.  Any new allergies since your discharge? No  Dietary orders reviewed? No Do you have support at home? Yes   Home Care and Equipment/Supplies: Were home health services ordered? not applicable  Follow up appointments reviewed:  PCP Hospital f/u appt confirmed?  Pt declined stated she would call back if one was needed If their condition worsens, is the pt aware to call PCP or go to the Emergency Dept.? Yes Was the patient provided with contact information for the PCP's office or ED? Yes Was to pt encouraged to call back with questions or concerns? Yes

## 2023-01-25 LAB — HM PAP SMEAR
HM Pap smear: NEGATIVE
HPV, high-risk: NEGATIVE

## 2023-01-25 LAB — RESULTS CONSOLE HPV: CHL HPV: NEGATIVE

## 2023-04-13 ENCOUNTER — Other Ambulatory Visit: Payer: Self-pay | Admitting: Medical

## 2023-04-14 NOTE — Telephone Encounter (Signed)
Has appt scheduled in August

## 2023-05-11 ENCOUNTER — Encounter (HOSPITAL_BASED_OUTPATIENT_CLINIC_OR_DEPARTMENT_OTHER): Payer: Self-pay | Admitting: Emergency Medicine

## 2023-05-11 ENCOUNTER — Other Ambulatory Visit: Payer: Self-pay

## 2023-05-11 ENCOUNTER — Emergency Department (HOSPITAL_BASED_OUTPATIENT_CLINIC_OR_DEPARTMENT_OTHER)
Admission: EM | Admit: 2023-05-11 | Discharge: 2023-05-11 | Disposition: A | Discharge status: Home / Self Care | Payer: BLUE CROSS/BLUE SHIELD | Attending: Emergency Medicine | Admitting: Emergency Medicine

## 2023-05-11 DIAGNOSIS — K0889 Other specified disorders of teeth and supporting structures: Secondary | ICD-10-CM | POA: Diagnosis present

## 2023-05-11 DIAGNOSIS — K029 Dental caries, unspecified: Secondary | ICD-10-CM | POA: Insufficient documentation

## 2023-05-11 DIAGNOSIS — K0381 Cracked tooth: Secondary | ICD-10-CM | POA: Diagnosis not present

## 2023-05-11 MED ORDER — OXYCODONE-ACETAMINOPHEN 5-325 MG PO TABS: 1.0000 | ORAL_TABLET | Freq: Three times a day (TID) | ORAL | 0 refills | Status: DC | PRN

## 2023-05-11 MED ORDER — AMOXICILLIN 500 MG PO CAPS: 500.0000 mg | ORAL_CAPSULE | Freq: Three times a day (TID) | ORAL | 0 refills | Status: DC

## 2023-05-11 MED ORDER — KETOROLAC TROMETHAMINE 15 MG/ML IJ SOLN
15.0000 mg | Freq: Once | INTRAMUSCULAR | Status: AC
  Administered 2023-05-11: 15 mg via INTRAMUSCULAR
  Filled 2023-05-11: qty 1

## 2023-05-11 NOTE — ED Provider Notes (Signed)
[facility] EMERGENCY DEPARTMENT AT [facility] Provider Note   CSN: [id number] Arrival date & time: 05/11/23  4098     History  Chief Complaint  Patient presents with   Dental Pain    [name] is a 32 y.o. female.   Dental Pain Patient presents with dental pain.  Left upper most posterior tooth has been broken off for a while now which started her a day ago.  Increasing pain and swelling.  No relief medicines at home.  No fevers.  No difficulty swallowing.    Past Medical History:  Diagnosis Date   Kikuchi disease    PCOS (polycystic ovarian syndrome) 2022   Sinusitis     Home Medications Prior to Admission medications   Medication Sig Start Date End Date Taking? Authorizing Provider  amoxicillin (AMOXIL) 500 MG capsule Take 1 capsule (500 mg total) by mouth 3 (three) times daily. 05/11/23  Yes Benjiman Core, MD  omeprazole (PRILOSEC) 40 MG capsule Take 1 capsule (40 mg total) by mouth daily. 10/05/22   Tysinger, Kermit Balo, PA-C  oxyCODONE-acetaminophen (PERCOCET/ROXICET) 5-325 MG tablet Take 1-2 tablets by mouth every 8 (eight) hours as needed for severe pain. 05/11/23  Yes Benjiman Core, MD  metFORMIN (GLUCOPHAGE) 500 MG tablet TAKE 1 TABLET BY MOUTH DAILY  WITH BREAKFAST 04/14/23   Tysinger, Kermit Balo, PA-C  Semaglutide-Weight Management (WEGOVY) 0.25 MG/0.5ML SOAJ Inject 0.25 mg into the skin once a week. 07/06/22   Tysinger, Kermit Balo, PA-C  tiZANidine (ZANAFLEX) 4 MG tablet Take 1 tablet (4 mg total) by mouth every 6 (six) hours as needed for muscle spasms. 06/30/22   Tysinger, Kermit Balo, PA-C      Allergies    Patient has no known allergies.    Review of Systems   Review of Systems  Physical Exam Updated Vital Signs BP (!) 139/93 (BP Location: Right Arm)   Pulse 79   Temp 98.9 F (37.2 C) (Oral)   Resp 16   Ht 5\' 9"  (1.753 m)   Wt 122.5 kg   LMP 03/31/2023   SpO2 100%   BMI 39.87 kg/m  Physical Exam Vitals reviewed.  HENT:     Mouth/Throat:      Comments: Left upper most posterior tooth is broken off of the gum.  Does have tenderness laterally but no fluctuance or swelling.  No trismus. Cardiovascular:     Rate and Rhythm: Regular rhythm.  Neurological:     Mental Status: She is alert.     ED Results / Procedures / Treatments   Labs (all labs ordered are listed, but only abnormal results are displayed) Labs Reviewed - No data to display  EKG None  Radiology No results found.  Procedures Procedures    Medications Ordered in ED Medications  ketorolac (TORADOL) 15 MG/ML injection 15 mg (15 mg Intramuscular Given 05/11/23 0848)    ED Course/ Medical Decision Making/ A&P                             Medical Decision Making Risk Prescription drug management.   Patient with dental pain and broken off tooth.  Does have tenderness laterally but no clear abscess.  Has a dentist but has not been able to see him yet.  Think patient benefit from pain medicine antibiotics.  Appears stable for discharge home.        Final Clinical Impression(s) / ED Diagnoses Final diagnoses:  Pain due to  dental caries    Rx / DC Orders ED Discharge Orders          Ordered    amoxicillin (AMOXIL) 500 MG capsule  3 times daily        05/11/23 0901    oxyCODONE-acetaminophen (PERCOCET/ROXICET) 5-325 MG tablet  Every 8 hours PRN        05/11/23 0901              Benjiman Core, MD 05/11/23 [phone]

## 2023-05-11 NOTE — ED Triage Notes (Signed)
Top L dental pain. States she has a broken tooth.

## 2023-05-11 NOTE — Discharge Instructions (Signed)
Follow-up with your dentist.  The pain medicine and antibiotics should help.

## 2023-05-11 NOTE — ED Notes (Signed)
Discharge paperwork given and verbally understood. 

## 2023-07-06 ENCOUNTER — Ambulatory Visit: Payer: BLUE CROSS/BLUE SHIELD | Admitting: Medical

## 2023-07-06 ENCOUNTER — Encounter: Payer: Self-pay | Admitting: Medical

## 2023-07-06 VITALS — BP 112/68 | HR 64 | Ht 69.5 in | Wt 263.4 lb

## 2023-07-06 DIAGNOSIS — Z13220 Encounter for screening for lipoid disorders: Secondary | ICD-10-CM | POA: Diagnosis not present

## 2023-07-06 DIAGNOSIS — Z0000 Encounter for general adult medical examination without abnormal findings: Secondary | ICD-10-CM

## 2023-07-06 DIAGNOSIS — Z131 Encounter for screening for diabetes mellitus: Secondary | ICD-10-CM

## 2023-07-06 DIAGNOSIS — Z720 Tobacco use: Secondary | ICD-10-CM

## 2023-07-06 DIAGNOSIS — E88819 Insulin resistance, unspecified: Secondary | ICD-10-CM | POA: Diagnosis not present

## 2023-07-06 DIAGNOSIS — Z2820 Immunization not carried out because of patient decision for unspecified reason: Secondary | ICD-10-CM

## 2023-07-06 DIAGNOSIS — Z3040 Encounter for surveillance of contraceptives, unspecified: Secondary | ICD-10-CM | POA: Diagnosis not present

## 2023-07-06 DIAGNOSIS — E282 Polycystic ovarian syndrome: Secondary | ICD-10-CM

## 2023-07-06 LAB — POCT URINALYSIS DIP (PROADVANTAGE DEVICE)
Bilirubin, UA: NEGATIVE
Blood, UA: NEGATIVE
Glucose, UA: NEGATIVE mg/dL
Ketones, POC UA: NEGATIVE mg/dL
Nitrite, UA: NEGATIVE
Protein Ur, POC: NEGATIVE mg/dL
Specific Gravity, Urine: 1.02
Urobilinogen, Ur: NEGATIVE
pH, UA: 7 (ref 5.0–8.0)

## 2023-07-06 LAB — POCT URINE PREGNANCY: Preg Test, Ur: NEGATIVE

## 2023-07-06 MED ORDER — NORETHINDRONE 0.35 MG PO TABS: 1.0000 | ORAL_TABLET | Freq: Every day | ORAL | 11 refills | Status: DC

## 2023-07-06 NOTE — Progress Notes (Signed)
Subjective:   HPI  [name] is a 32 y.o. female who presents for Chief Complaint  Patient presents with   Annual Exam    Fasting cpe, concerns about weight, follow-up on insulin level, has obgyn, declines flu shot    Patient Care Team: Finn Altemose, Kermit Balo, PA-C as PCP - General (Family Medicine) Ob/Gyn, Nestor Ramp Sees dentist Sees eye doctor [facility]   Concerns: Here for physical, fasting  Wonders if she needs higher dose of metformin, doesn't seems to give her any response.  Was having trouble finding wegovy last years.  Wants to resume medicaiton for weight management.  Sees gynecology again for a while.  Wants to go back on birth control. Has been on OCP pills prior.  Was interested in IUD but insurance wouldn't cover this.     Reviewed their medical, surgical, family, social, medication, and allergy history and updated chart as appropriate.  Past Medical History:  Diagnosis Date   Kikuchi disease    PCOS (polycystic ovarian syndrome) 2022   Sinusitis     Family History  Problem Relation Age of Onset   Cancer Mother        cervical   Diabetes Mother    Hypertension Father    Diabetes Father    Lupus Sister    Cancer Brother        bone   Cancer Paternal Aunt        breast   Heart disease Maternal Grandmother    Heart failure Other      Current Outpatient Medications:    metFORMIN (GLUCOPHAGE) 500 MG tablet, TAKE 1 TABLET BY MOUTH DAILY  WITH BREAKFAST, Disp: 90 tablet, Rfl: 0   norethindrone (ORTHO MICRONOR) 0.35 MG tablet, Take 1 tablet (0.35 mg total) by mouth daily., Disp: 28 tablet, Rfl: 11   omeprazole (PRILOSEC) 40 MG capsule, Take 1 capsule (40 mg total) by mouth daily., Disp: 90 capsule, Rfl: 1   Semaglutide-Weight Management (WEGOVY) 0.25 MG/0.5ML SOAJ, Inject 0.25 mg into the skin once a week., Disp: 2 mL, Rfl: 0  No Known Allergies  Review of Systems  Constitutional:  Negative for chills, fever, malaise/fatigue and weight loss.   HENT:  Negative for congestion, ear pain, hearing loss, sore throat and tinnitus.   Eyes:  Negative for blurred vision, pain and redness.  Respiratory:  Negative for cough, hemoptysis and shortness of breath.   Cardiovascular:  Negative for chest pain, palpitations, orthopnea, claudication and leg swelling.  Gastrointestinal:  Negative for abdominal pain, blood in stool, constipation, diarrhea, nausea and vomiting.  Genitourinary:  Negative for dysuria, flank pain, frequency, hematuria and urgency.  Musculoskeletal:  Negative for falls, joint pain and myalgias.  Skin:  Negative for itching and rash.  Neurological:  Negative for dizziness, tingling, speech change, weakness and headaches.  Endo/Heme/Allergies:  Negative for polydipsia. Does not bruise/bleed easily.  Psychiatric/Behavioral:  Negative for depression and memory loss. The patient is not nervous/anxious and does not have insomnia.         07/06/2023    9:35 AM 06/30/2022    2:14 PM 05/18/2022   11:40 AM  Depression screen PHQ 2/9  Decreased Interest 0 0 0  Down, Depressed, Hopeless 0 0 0  PHQ - 2 Score 0 0 0       Objective:  BP 112/68   Pulse 64   Ht 5' 9.5" (1.765 m)   Wt 263 lb 6.4 oz (119.5 kg)   BMI 38.34 kg/m   Wt  Readings from Last 3 Encounters:  07/06/23 263 lb 6.4 oz (119.5 kg)  05/11/23 270 lb (122.5 kg)  06/30/22 278 lb 3.2 oz (126.2 kg)    General appearance: alert, no distress, WD/WN, African American female Skin: few skin tags of bilat face and lower eyelids left and right, tattoo left forearm, HEENT: normocephalic, conjunctiva/corneas normal, sclerae anicteric, PERRLA, EOMi, nares patent, no discharge or erythema, pharynx normal Oral cavity: MMM, tongue normal, teeth normal Neck: supple, no lymphadenopathy, mild general thyromegaly, no masses, normal ROM, no bruits Chest: non tender, normal shape and expansion Heart: RRR, normal S1, S2, no murmurs Lungs: CTA bilaterally, no wheezes, rhonchi, or  rales Abdomen: +bs, soft, non tender, non distended, no masses, no hepatomegaly, no splenomegaly, no bruits Back: non tender, normal ROM, no scoliosis Musculoskeletal: upper extremities non tender, no obvious deformity, normal ROM throughout, lower extremities non tender, no obvious deformity, normal ROM throughout Extremities: no edema, no cyanosis, no clubbing Pulses: 2+ symmetric, upper and lower extremities, normal cap refill Neurological: alert, oriented x 3, CN2-12 intact, strength normal upper extremities and lower extremities, sensation normal throughout, DTRs 2+ throughout, no cerebellar signs, gait normal Psychiatric: normal affect, behavior normal, pleasant  Breast/gyn/rectal - deferred to gynecology     Assessment and Plan :   Encounter Diagnoses  Name Primary?   Encounter for health maintenance examination in adult Yes   Insulin resistance    PCOS (polycystic ovarian syndrome)    Screening for lipid disorders    Screening for diabetes mellitus    Vaccine refused by patient    Encounter for surveillance of contraceptives, unspecified contraceptive    Current occasional smoker     This visit was a preventative care visit, also known as wellness visit or routine physical.   Topics typically include healthy lifestyle, diet, exercise, preventative care, vaccinations, sick and well care, proper use of emergency dept and after hours care, as well as other concerns.     Recommendations: Continue to return yearly for your annual wellness and preventative care visits.  This gives [place] a chance to discuss healthy lifestyle, exercise, vaccinations, review your chart record, and perform screenings where appropriate.  I recommend you see your eye doctor yearly for routine vision care.  I recommend you see your dentist yearly for routine dental care including hygiene visits twice yearly.  See your gynecologist yearly for routine gynecological care.    Vaccination recommendations  were reviewed Declines flu shot   Screening for cancer: Colon cancer screening: Age 89  Breast cancer screening: You should perform a self breast exam monthly.   We reviewed recommendations for regular mammograms and breast cancer screening.  Cervical cancer screening: We reviewed recommendations for pap smear screening.   Skin cancer screening: Check your skin regularly for new changes, growing lesions, or other lesions of concern Come in for evaluation if you have skin lesions of concern.  Lung cancer screening: If you have a greater than 20 pack year history of tobacco use, then you may qualify for lung cancer screening with a chest CT scan.   Please call your insurance company to inquire about coverage for this test.  We currently don't have screenings for other cancers besides breast, cervical, colon, and lung cancers.  If you have a strong family history of cancer or have other cancer screening concerns, please let me know.    Bone health: Get at least 150 minutes of aerobic exercise weekly Get weight bearing exercise at least once weekly Bone density  test:  A bone density test is an imaging test that uses a type of X-ray to measure the amount of calcium and other minerals in your bones. The test may be used to diagnose or screen you for a condition that causes weak or thin bones (osteoporosis), predict your risk for a broken bone (fracture), or determine how well your osteoporosis treatment is working. The bone density test is recommended for females 65 and older, or females or males <65 if certain risk factors such as thyroid disease, long term use of steroids such as for asthma or rheumatological issues, vitamin D deficiency, estrogen deficiency, family history of osteoporosis, self or family history of fragility fracture in first degree relative.    Heart health: Get at least 150 minutes of aerobic exercise weekly Limit alcohol It is important to maintain a healthy  blood pressure and healthy cholesterol numbers  Heart disease screening: Screening for heart disease includes screening for blood pressure, fasting lipids, glucose/diabetes screening, BMI height to weight ratio, reviewed of smoking status, physical activity, and diet.    Goals include blood pressure 120/80 or less, maintaining a healthy lipid/cholesterol profile, preventing diabetes or keeping diabetes numbers under good control, not smoking or using tobacco products, exercising most days per week or at least 150 minutes per week of exercise, and eating healthy variety of fruits and vegetables, healthy oils, and avoiding unhealthy food choices like fried food, fast food, high sugar and high cholesterol foods.    Other tests may possibly include EKG test, CT coronary calcium score, echocardiogram, exercise treadmill stress test.   Reviewed 11/2021 EKG on file, normal   Medical care options: I recommend you continue to seek care here first for routine care.  We try really hard to have available appointments Monday through Friday daytime hours for sick visits, acute visits, and physicals.  Urgent care should be used for after hours and weekends for significant issues that cannot wait till the next day.  The emergency department should be used for significant potentially life-threatening emergencies.  The emergency department is expensive, can often have long wait times for less significant concerns, so try to utilize primary care, urgent care, or telemedicine when possible to avoid unnecessary trips to the emergency department.  Virtual visits and telemedicine have been introduced since the pandemic started in 2020, and can be convenient ways to receive medical care.  We offer virtual appointments as well to assist you in a variety of options to seek medical care.   Advanced Directives: I recommend you consider completing a Health Care Power of Attorney and Living Will.   These documents respect your  wishes and help alleviate burdens on your loved ones if you were to become terminally ill or be in a position to need those documents enforced.    You can complete Advanced Directives yourself, have them notarized, then have copies made for our office, for you and for anybody you feel should have them in safe keeping.  Or, you can have an attorney prepare these documents.   If you haven't updated your Last Will and Testament in a while, it may be worthwhile having an attorney prepare these documents together and save on some costs.       Separate significant issues discussed: PCOS - on metformin, seeing gynecology  Insulin resistance - updated labs today  Update labs today  Continue efforts to lose weight thorough healthy diet and exercise  Contraception - counseled on risks, benefits and options.  Given tobacco  use, I advised we not use estrogen based OCP.  Begin trial of medication below, or consider f/u with gyn to discuss IUD again which is what she preferred.     Jalon was seen today for annual exam.  Diagnoses and all orders for this visit:  Encounter for health maintenance examination in adult -     Comprehensive metabolic panel -     CBC -     Lipid panel -     Hemoglobin A1c -     Prolactin -     TSH -     POCT Urinalysis DIP (Proadvantage Device) -     Insulin and C-Peptide  Insulin resistance -     Prolactin -     TSH -     Insulin and C-Peptide  PCOS (polycystic ovarian syndrome)  Screening for lipid disorders -     Lipid panel  Screening for diabetes mellitus -     Hemoglobin A1c  Vaccine refused by patient  Encounter for surveillance of contraceptives, unspecified contraceptive -     POCT urine pregnancy  Current occasional smoker  Other orders -     norethindrone (ORTHO MICRONOR) 0.35 MG tablet; Take 1 tablet (0.35 mg total) by mouth daily.    Follow-up pending labs, yearly for physical

## 2023-07-07 ENCOUNTER — Other Ambulatory Visit: Payer: Self-pay | Admitting: Medical

## 2023-07-07 LAB — CBC
Hematocrit: 40.5 % (ref 34.0–46.6)
Hemoglobin: 13.3 g/dL (ref 11.1–15.9)
MCH: 26.1 pg — ABNORMAL LOW (ref 26.6–33.0)
MCHC: 32.8 g/dL (ref 31.5–35.7)
MCV: 80 fL (ref 79–97)
Platelets: 216 10*3/uL (ref 150–450)
RBC: 5.09 x10E6/uL (ref 3.77–5.28)
RDW: 15.4 % (ref 11.7–15.4)
WBC: 4 10*3/uL (ref 3.4–10.8)

## 2023-07-07 LAB — COMPREHENSIVE METABOLIC PANEL
ALT: 34 IU/L — ABNORMAL HIGH (ref 0–32)
AST: 30 IU/L (ref 0–40)
Albumin: 4 g/dL (ref 3.9–4.9)
Alkaline Phosphatase: 37 IU/L — ABNORMAL LOW (ref 44–121)
BUN/Creatinine Ratio: 12 (ref 9–23)
BUN: 8 mg/dL (ref 6–20)
Bilirubin Total: 0.5 mg/dL (ref 0.0–1.2)
CO2: 21 mmol/L (ref 20–29)
Calcium: 9.3 mg/dL (ref 8.7–10.2)
Chloride: 105 mmol/L (ref 96–106)
Creatinine, Ser: 0.65 mg/dL (ref 0.57–1.00)
Globulin, Total: 3.7 g/dL (ref 1.5–4.5)
Glucose: 84 mg/dL (ref 70–99)
Potassium: 4.2 mmol/L (ref 3.5–5.2)
Sodium: 139 mmol/L (ref 134–144)
Total Protein: 7.7 g/dL (ref 6.0–8.5)
eGFR: 120 mL/min/{1.73_m2} (ref >59)

## 2023-07-07 LAB — LIPID PANEL
Chol/HDL Ratio: 5.4 ratio — ABNORMAL HIGH (ref 0.0–4.4)
Cholesterol, Total: 184 mg/dL (ref 100–199)
HDL: 34 mg/dL — ABNORMAL LOW (ref >39)
LDL Chol Calc (NIH): 124 mg/dL — ABNORMAL HIGH (ref 0–99)
Triglycerides: 144 mg/dL (ref 0–149)
VLDL Cholesterol Cal: 26 mg/dL (ref 5–40)

## 2023-07-07 LAB — HEMOGLOBIN A1C
Est. average glucose Bld gHb Est-mCnc: 120 mg/dL
Hgb A1c MFr Bld: 5.8 % — ABNORMAL HIGH (ref 4.8–5.6)

## 2023-07-07 LAB — TSH: TSH: 1.98 u[IU]/mL (ref 0.450–4.500)

## 2023-07-07 LAB — INSULIN AND C-PEPTIDE, SERUM
C-Peptide: 4.1 ng/mL (ref 1.1–4.4)
INSULIN: 27.8 u[IU]/mL — ABNORMAL HIGH (ref 2.6–24.9)

## 2023-07-07 LAB — PROLACTIN: Prolactin: 10.4 ng/mL (ref 4.8–33.4)

## 2023-07-07 NOTE — Progress Notes (Signed)
Results sent through MyChart

## 2023-07-07 NOTE — Progress Notes (Signed)
Insulin level is actually improved compared to last year, C-peptide normal.  See other message

## 2023-07-08 ENCOUNTER — Telehealth: Payer: Self-pay

## 2023-07-08 ENCOUNTER — Encounter: Payer: Self-pay | Admitting: Internal Medicine

## 2023-07-08 NOTE — Telephone Encounter (Signed)
[facility] Rx #: [id number] Drug: Wegovy 0.25MG /0.5ML auto-injectors Form: [employer] Electronic PA Form (2017 NCPDP)  Status: Wait for Determination

## 2023-07-08 NOTE — Telephone Encounter (Signed)
Left voicemail to notify pt of denial of wegovy due to it not being covered under her prescription benefit plan. Advised to call insurance and ask which (if any) weight loss medications are covered under plan.

## 2023-07-14 NOTE — Telephone Encounter (Signed)
Left another voicemail this morning. Unable to leave voicemail.

## 2023-08-10 ENCOUNTER — Ambulatory Visit: Payer: BLUE CROSS/BLUE SHIELD | Admitting: Medical

## 2023-08-10 ENCOUNTER — Encounter: Payer: Self-pay | Admitting: Medical

## 2023-11-15 ENCOUNTER — Encounter: Payer: BLUE CROSS/BLUE SHIELD | Admitting: Obstetrics and Gynecology

## 2023-12-05 ENCOUNTER — Emergency Department (HOSPITAL_BASED_OUTPATIENT_CLINIC_OR_DEPARTMENT_OTHER)
Admission: EM | Admit: 2023-12-05 | Discharge: 2023-12-05 | Disposition: A | Discharge status: Home / Self Care | Payer: Self-pay | Attending: Emergency Medicine | Admitting: Emergency Medicine

## 2023-12-05 ENCOUNTER — Other Ambulatory Visit: Payer: Self-pay

## 2023-12-05 ENCOUNTER — Encounter (HOSPITAL_BASED_OUTPATIENT_CLINIC_OR_DEPARTMENT_OTHER): Payer: Self-pay

## 2023-12-05 DIAGNOSIS — N3000 Acute cystitis without hematuria: Secondary | ICD-10-CM | POA: Insufficient documentation

## 2023-12-05 DIAGNOSIS — J101 Influenza due to other identified influenza virus with other respiratory manifestations: Secondary | ICD-10-CM

## 2023-12-05 DIAGNOSIS — Z20822 Contact with and (suspected) exposure to covid-19: Secondary | ICD-10-CM | POA: Insufficient documentation

## 2023-12-05 DIAGNOSIS — J09X2 Influenza due to identified novel influenza A virus with other respiratory manifestations: Secondary | ICD-10-CM | POA: Insufficient documentation

## 2023-12-05 DIAGNOSIS — R350 Frequency of micturition: Secondary | ICD-10-CM | POA: Insufficient documentation

## 2023-12-05 LAB — RESP PANEL BY RT-PCR (RSV, FLU A&B, COVID)  RVPGX2
Influenza A by PCR: POSITIVE — AB
Influenza B by PCR: NEGATIVE
Resp Syncytial Virus by PCR: NEGATIVE
SARS Coronavirus 2 by RT PCR: NEGATIVE

## 2023-12-05 LAB — URINALYSIS, W/ REFLEX TO CULTURE (INFECTION SUSPECTED)
Bilirubin Urine: NEGATIVE
Glucose, UA: NEGATIVE mg/dL
Hgb urine dipstick: NEGATIVE
Ketones, ur: NEGATIVE mg/dL
Nitrite: NEGATIVE
Specific Gravity, Urine: 1.024 (ref 1.005–1.030)
pH: 7.5 (ref 5.0–8.0)

## 2023-12-05 MED ORDER — OSELTAMIVIR PHOSPHATE 75 MG PO CAPS: 75.0000 mg | ORAL_CAPSULE | Freq: Two times a day (BID) | ORAL | 0 refills | Status: DC

## 2023-12-05 MED ORDER — ACETAMINOPHEN 325 MG PO TABS
650.0000 mg | ORAL_TABLET | Freq: Once | ORAL | Status: AC | PRN
  Administered 2023-12-05: 650 mg via ORAL
  Filled 2023-12-05: qty 2

## 2023-12-05 MED ORDER — CEPHALEXIN 500 MG PO CAPS: 500.0000 mg | ORAL_CAPSULE | Freq: Two times a day (BID) | ORAL | 0 refills | Status: AC

## 2023-12-05 NOTE — ED Notes (Signed)
Dc instructions reviewed with patient. Patient voiced understanding. Dc with belongings.

## 2023-12-05 NOTE — ED Triage Notes (Signed)
In for eval of fatigue, fever, body aches, dry cough, headache and slight right ear ache. Onset Friday. Theraflu last pm.

## 2023-12-05 NOTE — ED Provider Notes (Signed)
[place] EMERGENCY DEPARTMENT AT [facility] Provider Note   CSN: [id number] Arrival date & time: 12/05/23  3244     History  Chief Complaint  Patient presents with   Fever    [name] is a 33 y.o. female.  The history is provided by the patient and medical records. No language interpreter was used.  Fever Temp source:  Subjective Severity:  Moderate Onset quality:  Gradual Duration:  2 days Timing:  Constant Progression:  Waxing and waning Chronicity:  New Relieved by:  Nothing Worsened by:  Nothing Ineffective treatments:  None tried Associated symptoms: chills, congestion, cough, myalgias and rhinorrhea   Associated symptoms: no chest pain (urinary frequency), no confusion, no diarrhea, no dysuria, no headaches, no nausea, no rash and no sore throat   Risk factors: sick contacts        Home Medications Prior to Admission medications   Medication Sig Start Date End Date Taking? Authorizing Provider  metFORMIN (GLUCOPHAGE) 500 MG tablet TAKE 1 TABLET BY MOUTH DAILY  WITH BREAKFAST 04/14/23  Yes Tysinger, Kermit Balo, PA-C  omeprazole (PRILOSEC) 40 MG capsule Take 1 capsule (40 mg total) by mouth daily. 10/05/22  Yes Tysinger, Kermit Balo, PA-C  norethindrone (ORTHO MICRONOR) 0.35 MG tablet Take 1 tablet (0.35 mg total) by mouth daily. 07/06/23   Tysinger, Kermit Balo, PA-C  WEGOVY 0.25 MG/0.5ML SOAJ INJECT 0.25 MG INTO THE SKIN ONE TIME PER WEEK 07/07/23   Tysinger, Kermit Balo, PA-C      Allergies    Patient has no known allergies.    Review of Systems   Review of Systems  Constitutional:  Positive for chills and fever.  HENT:  Positive for congestion and rhinorrhea. Negative for sore throat.   Respiratory:  Positive for cough. Negative for chest tightness, shortness of breath and wheezing.   Cardiovascular:  Negative for chest pain (urinary frequency).  Gastrointestinal:  Negative for abdominal pain, constipation, diarrhea and nausea.  Genitourinary:  Positive  for frequency. Negative for dysuria and flank pain.  Musculoskeletal:  Positive for myalgias. Negative for back pain.  Skin:  Negative for rash.  Neurological:  Negative for weakness, light-headedness, numbness and headaches.  Psychiatric/Behavioral:  Negative for agitation and confusion.   All other systems reviewed and are negative.   Physical Exam Updated Vital Signs BP 117/84 (BP Location: Right Arm)   Pulse (!) 103   Temp (!) 101.5 F (38.6 C) (Oral)   Resp 16   Ht 5\' 9"  (1.753 m)   Wt 115.7 kg   SpO2 98%   BMI 37.66 kg/m  Physical Exam Vitals and nursing note reviewed.  Constitutional:      General: She is not in acute distress.    Appearance: She is well-developed. She is not ill-appearing, toxic-appearing or diaphoretic.  HENT:     Head: Normocephalic and atraumatic.     Nose: Congestion present. No rhinorrhea.     Mouth/Throat:     Pharynx: No oropharyngeal exudate or posterior oropharyngeal erythema.  Eyes:     Extraocular Movements: Extraocular movements intact.     Conjunctiva/sclera: Conjunctivae normal.     Pupils: Pupils are equal, round, and reactive to light.  Cardiovascular:     Rate and Rhythm: Normal rate and regular rhythm.     Heart sounds: No murmur heard. Pulmonary:     Effort: Pulmonary effort is normal. No respiratory distress.     Breath sounds: Normal breath sounds. No wheezing, rhonchi or rales.  Chest:  Chest wall: No tenderness.  Abdominal:     General: Abdomen is flat.     Palpations: Abdomen is soft.     Tenderness: There is no abdominal tenderness. There is no right CVA tenderness, left CVA tenderness, guarding or rebound.  Musculoskeletal:        General: No swelling or tenderness.     Cervical back: Neck supple. No tenderness.     Right lower leg: No edema.     Left lower leg: No edema.  Skin:    General: Skin is warm and dry.     Capillary Refill: Capillary refill takes less than 2 seconds.     Findings: No erythema or  rash.  Neurological:     General: No focal deficit present.     Mental Status: She is alert.  Psychiatric:        Mood and Affect: Mood normal.     ED Results / Procedures / Treatments   Labs (all labs ordered are listed, but only abnormal results are displayed) Labs Reviewed  RESP PANEL BY RT-PCR (RSV, FLU A&B, COVID)  RVPGX2 - Abnormal; Notable for the following components:      Result Value   Influenza A by PCR POSITIVE (*)    All other components within normal limits  URINALYSIS, W/ REFLEX TO CULTURE (INFECTION SUSPECTED) - Abnormal; Notable for the following components:   Protein, ur TRACE (*)    Leukocytes,Ua MODERATE (*)    Bacteria, UA RARE (*)    All other components within normal limits  URINE CULTURE    EKG None  Radiology No results found.  Procedures Procedures    Medications Ordered in ED Medications  acetaminophen (TYLENOL) tablet 650 mg (650 mg Oral Given 12/05/23 1610)    ED Course/ Medical Decision Making/ A&P                                 Medical Decision Making Risk OTC drugs.    [name] is a 33 y.o. female with a past medical history significant for PCOS and Kikuchi disease who presents with URI symptoms.  Patient reports congestion, cough, malaise, myalgias, and fatigue for the last 2 days or so.  She reports a coworker may have recently had influenza.  She reports some urinary frequency as well but denies dysuria.  Denies neck pain, neck stiffness, and denies severe headache.  Reports no nausea, vomiting, constipation, or diarrhea.  She does report urinary frequency but denies dysuria.  She is unsure if she has UTI or not.  Denies any flank or back pain.  Primarily has a URI symptoms and myalgias.  On exam, lungs clear.  Chest nontender.  Abdomen nontender.  Patient moving all extremities.  No tenderness in the head or neck.  Pupil symmetric and reactive with normal extract movements.  No focal neurologic deficits.  Patient resting  comfortably.  Due to the frequency will get urinalysis.  Given the ongoing pandemic and the amount of fluid in the community will check her for viral illness.  Will hold on other labs or x-ray at this time given the reassuring exam.  Patient is flu a positive.  Suspect this is the main cause of symptoms.  Urinalysis did show leukocytes and bacteria and given her urinary symptoms that she says is different than normal, we will treat with antibiotics for UTI.  Patient understood follow-up instructions and return precautions and was discharged  in good condition with prescription for Keflex for the UTI and Tamiflu for the influenza A.         Final Clinical Impression(s) / ED Diagnoses Final diagnoses:  Influenza A  Acute cystitis without hematuria  Urinary frequency    Rx / DC Orders ED Discharge Orders          Ordered    cephALEXin (KEFLEX) 500 MG capsule  2 times daily        12/05/23 0954    oseltamivir (TAMIFLU) 75 MG capsule  Every 12 hours        12/05/23 0954            Clinical Impression: 1. Influenza A   2. Acute cystitis without hematuria   3. Urinary frequency     Disposition: Discharge  Condition: Good  I have discussed the results, Dx and Tx plan with the pt(& family if present). He/she/they expressed understanding and agree(s) with the plan. Discharge instructions discussed at great length. Strict return precautions discussed and pt &/or family have verbalized understanding of the instructions. No further questions at time of discharge.    New Prescriptions   CEPHALEXIN (KEFLEX) 500 MG CAPSULE    Take 1 capsule (500 mg total) by mouth 2 (two) times daily for 7 days.   OSELTAMIVIR (TAMIFLU) 75 MG CAPSULE    Take 1 capsule (75 mg total) by mouth every 12 (twelve) hours.    Follow Up: Genia Del [address] [place] [address] [phone]     [facility] Emergency Department at [facility] [address] [place] [address] [phone]        Ozetta Flatley, Canary Brim, MD 12/05/23 1003

## 2023-12-05 NOTE — Discharge Instructions (Signed)
Your history, exam, workup today show you do indeed have influenza A as the cause of the majority of your symptoms however your urinary frequency is likely due to urinary tract infection was found on the urinalysis.  Please take the antibiotics to treat the UTI and consider taking the Tamiflu to help shorten the duration of your influenza illness.  Please rest and stay hydrated and follow-up with your primary doctor.  If any symptoms change or worsen acutely, please return to the nearest emergency department.

## 2023-12-06 LAB — URINE CULTURE

## 2023-12-23 IMAGING — DX DG CHEST 2V
2 series · 2 of 2 positions shown · non-contrast
Comparison: November 10, 2013

CLINICAL DATA: Shortness of breath.

EXAM:
CHEST - 2 VIEW

[chest pa]
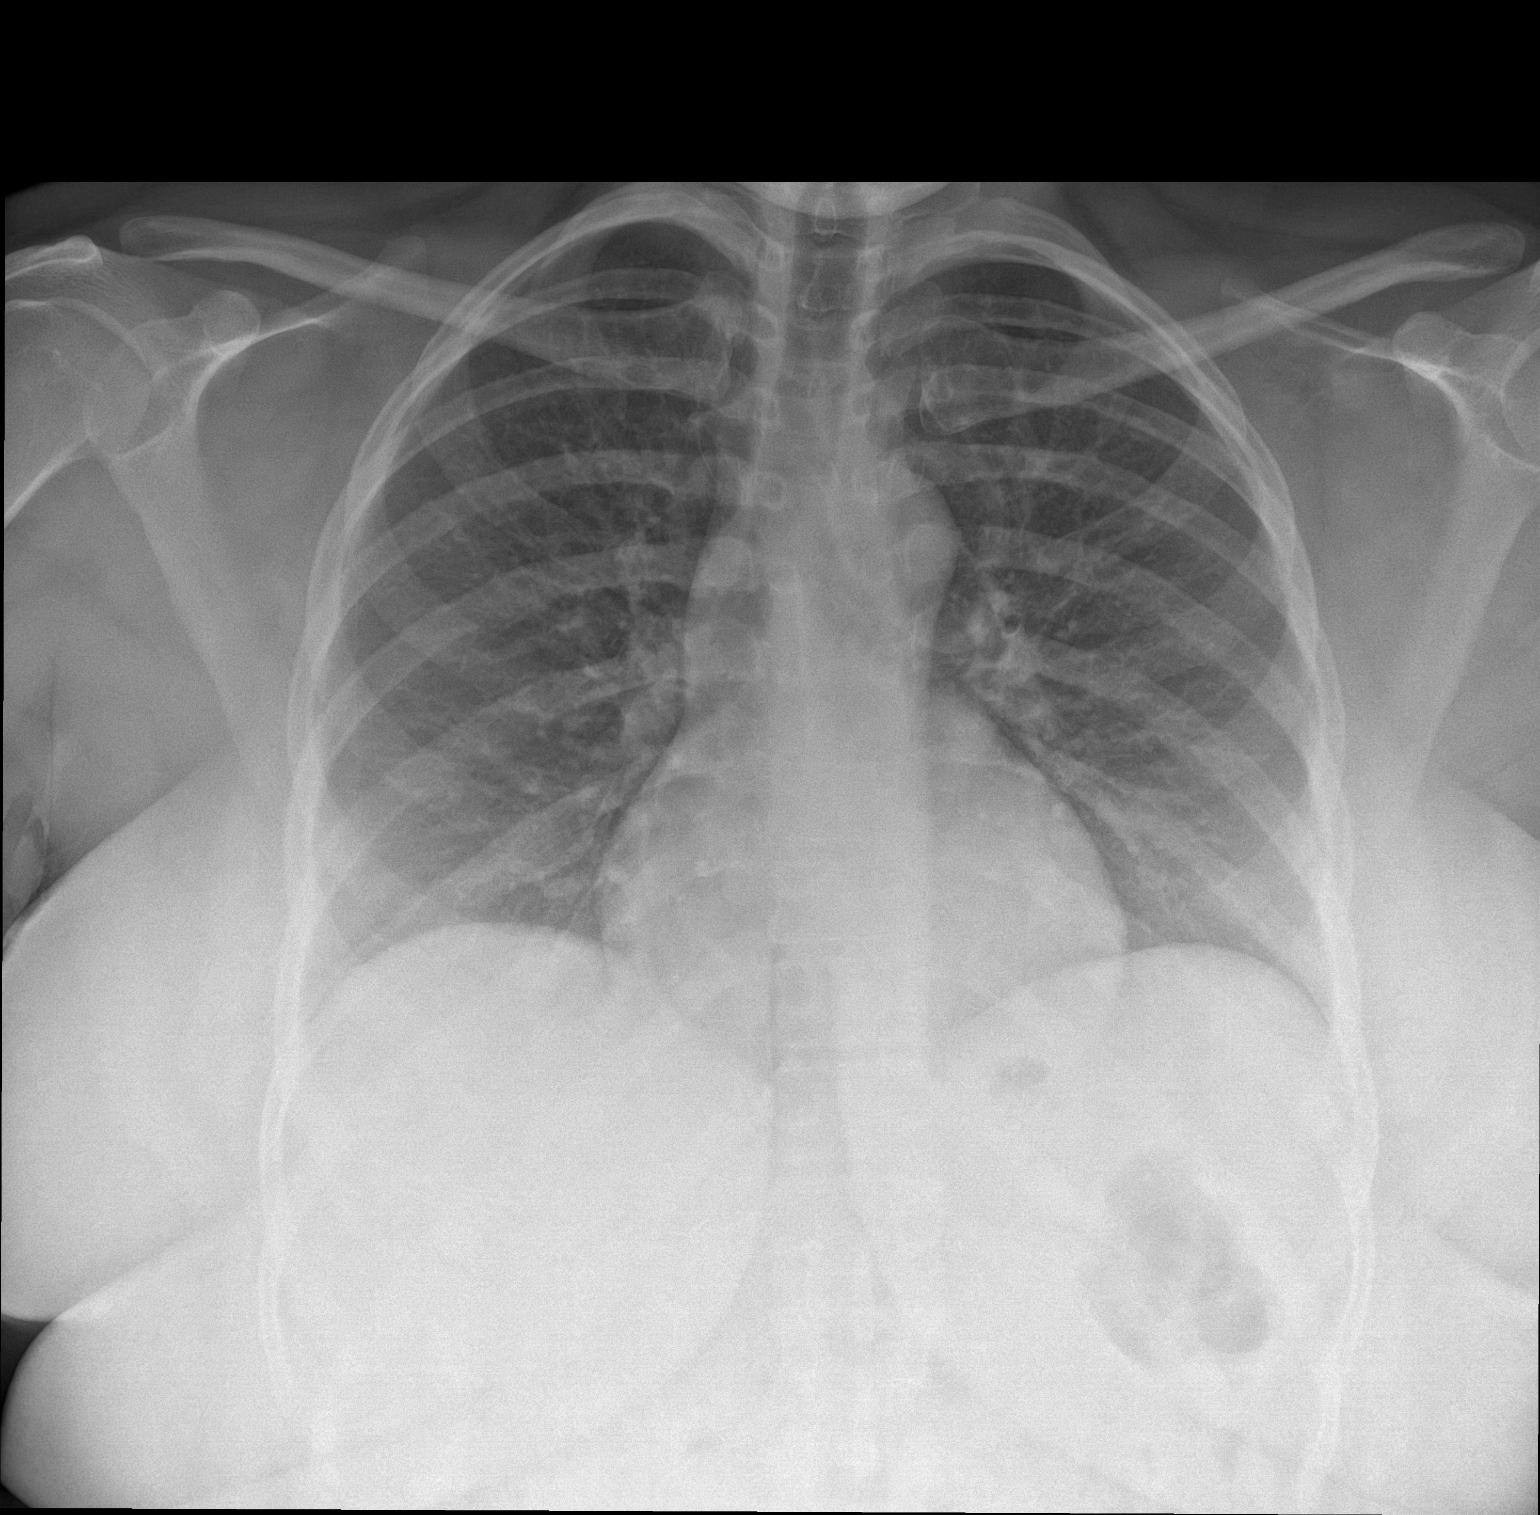

[chest lat]
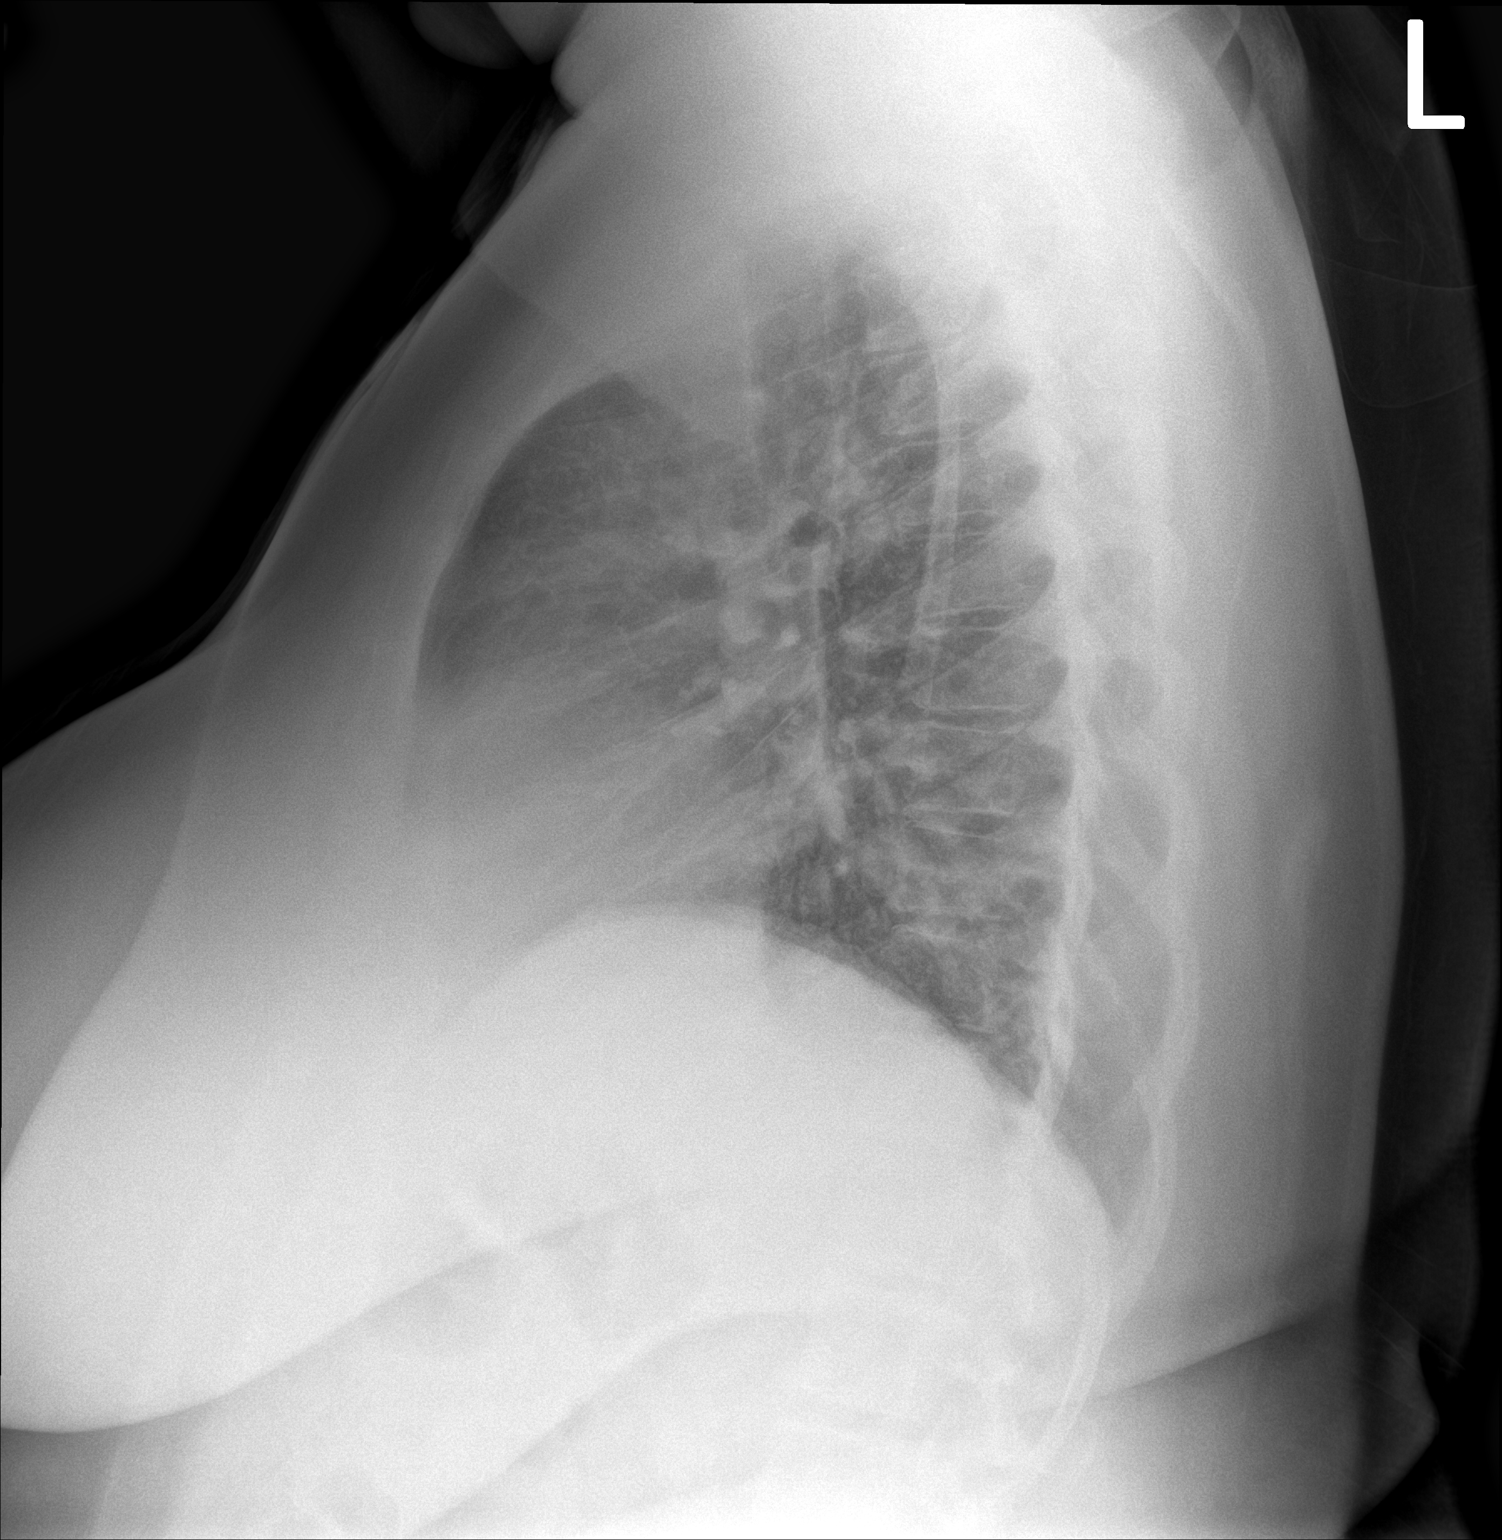

[2 of 2 positions shown; findings below may reference images not displayed]

FINDINGS: The heart size and mediastinal contours are within normal limits.
Both lungs are clear. The visualized skeletal structures are
unremarkable.
IMPRESSION: No active cardiopulmonary disease.

## 2024-03-08 DIAGNOSIS — Z124 Encounter for screening for malignant neoplasm of cervix: Secondary | ICD-10-CM | POA: Diagnosis not present

## 2024-03-08 DIAGNOSIS — Z808 Family history of malignant neoplasm of other organs or systems: Secondary | ICD-10-CM | POA: Diagnosis not present

## 2024-03-08 DIAGNOSIS — Z8041 Family history of malignant neoplasm of ovary: Secondary | ICD-10-CM | POA: Diagnosis not present

## 2024-03-08 DIAGNOSIS — Z3202 Encounter for pregnancy test, result negative: Secondary | ICD-10-CM | POA: Diagnosis not present

## 2024-03-08 DIAGNOSIS — Z803 Family history of malignant neoplasm of breast: Secondary | ICD-10-CM | POA: Diagnosis not present

## 2024-03-08 DIAGNOSIS — N912 Amenorrhea, unspecified: Secondary | ICD-10-CM | POA: Diagnosis not present

## 2024-03-08 DIAGNOSIS — Z6835 Body mass index (BMI) 35.0-35.9, adult: Secondary | ICD-10-CM | POA: Diagnosis not present

## 2024-03-08 DIAGNOSIS — Z01419 Encounter for gynecological examination (general) (routine) without abnormal findings: Secondary | ICD-10-CM | POA: Diagnosis not present

## 2024-03-08 DIAGNOSIS — Z1151 Encounter for screening for human papillomavirus (HPV): Secondary | ICD-10-CM | POA: Diagnosis not present

## 2024-03-08 DIAGNOSIS — L680 Hirsutism: Secondary | ICD-10-CM | POA: Diagnosis not present

## 2024-03-08 DIAGNOSIS — E282 Polycystic ovarian syndrome: Secondary | ICD-10-CM | POA: Diagnosis not present

## 2024-04-02 ENCOUNTER — Emergency Department (HOSPITAL_COMMUNITY)
Admission: EM | Admit: 2024-04-02 | Discharge: 2024-04-02 | Disposition: A | Discharge status: Home / Self Care | Payer: Self-pay | Attending: Emergency Medicine | Admitting: Emergency Medicine

## 2024-04-02 ENCOUNTER — Encounter (HOSPITAL_COMMUNITY): Payer: Self-pay

## 2024-04-02 ENCOUNTER — Other Ambulatory Visit: Payer: Self-pay

## 2024-04-02 ENCOUNTER — Ambulatory Visit: Payer: Self-pay

## 2024-04-02 DIAGNOSIS — B309 Viral conjunctivitis, unspecified: Secondary | ICD-10-CM | POA: Insufficient documentation

## 2024-04-02 DIAGNOSIS — H1013 Acute atopic conjunctivitis, bilateral: Secondary | ICD-10-CM | POA: Insufficient documentation

## 2024-04-02 MED ORDER — OLOPATADINE HCL 0.1 % OP SOLN: 1.0000 [drp] | Freq: Two times a day (BID) | OPHTHALMIC | 12 refills | Status: DC

## 2024-04-02 MED ORDER — FLUORESCEIN SODIUM 1 MG OP STRP
1.0000 | ORAL_STRIP | Freq: Once | OPHTHALMIC | Status: AC
  Administered 2024-04-02: 1 via OPHTHALMIC
  Filled 2024-04-02: qty 1

## 2024-04-02 MED ORDER — TETRACAINE HCL 0.5 % OP SOLN
1.0000 [drp] | Freq: Once | OPHTHALMIC | Status: AC
  Administered 2024-04-02: 1 [drp] via OPHTHALMIC
  Filled 2024-04-02: qty 4

## 2024-04-02 NOTE — ED Triage Notes (Signed)
 Pt arrives via POV. Pt reports 3 days ago she began experiencing redness, drainage, and itching in her right eye. Pt was started on an antibiotic. She reports she now has blurred vision in both eyes since last night. Continues to have redness and drainage. Denies any other issues. Pt AxOx4.

## 2024-04-02 NOTE — ED Provider Notes (Signed)
 [place] [facility] DEPARTMENT AT [facility] Provider Note   CSN: [id number] Arrival date & time: 04/02/24  0909     History  Chief Complaint  Patient presents with   Eye Problem    [name] is a 33 y.o. female.  Notes bilateral eye drainage, itching and irritation.  Does not wear contacts.  Has been on 1 day of ofloxacin eyedrops.  Does not note any specific eyeball pain or swelling around eyelid. No vision change. Dose not think FB in eye. No vision change.    Eye Problem Associated symptoms: discharge, itching and redness        Home Medications Prior to Admission medications   Medication Sig Start Date End Date Taking? Authorizing Provider  metFORMIN  (GLUCOPHAGE ) 500 MG tablet TAKE 1 TABLET BY MOUTH DAILY  WITH BREAKFAST 04/14/23   Tysinger, Christiane Cowing, PA-C  norethindrone  (ORTHO MICRONOR ) 0.35 MG tablet Take 1 tablet (0.35 mg total) by mouth daily. 07/06/23   Tysinger, Christiane Cowing, PA-C  omeprazole  (PRILOSEC) 40 MG capsule Take 1 capsule (40 mg total) by mouth daily. 10/05/22   Tysinger, Christiane Cowing, PA-C  oseltamivir  (TAMIFLU ) 75 MG capsule Take 1 capsule (75 mg total) by mouth every 12 (twelve) hours. 12/05/23   Tegeler, Marine Sia, MD  WEGOVY  0.25 MG/0.5ML SOAJ INJECT 0.25 MG INTO THE SKIN ONE TIME PER WEEK 07/07/23   Tysinger, Christiane Cowing, PA-C      Allergies    Patient has no known allergies.    Review of Systems   Review of Systems  Eyes:  Positive for discharge, redness and itching.    Physical Exam Updated Vital Signs BP 130/87 (BP Location: Right Arm)   Pulse 85   Temp 98.3 F (36.8 C)   Resp 17   Ht 5\' 9"  (1.753 m)   Wt 108.9 kg   SpO2 100%   BMI 35.44 kg/m  Physical Exam Vitals and nursing note reviewed.  Constitutional:      General: She is not in acute distress.    Appearance: She is not toxic-appearing.  HENT:     Head: Normocephalic and atraumatic.  Eyes:     General: Lids are normal. Lids are everted, no foreign bodies appreciated.  Vision grossly intact. Allergic shiner present. No visual field deficit or scleral icterus.       Right eye: No foreign body, discharge or hordeolum.        Left eye: No foreign body, discharge or hordeolum.     Intraocular pressure: Right eye pressure is 15 mmHg. Left eye pressure is 15 mmHg.     Extraocular Movements:     Right eye: Normal extraocular motion and no nystagmus.     Left eye: Normal extraocular motion and no nystagmus.     Conjunctiva/sclera:     Right eye: Right conjunctiva is injected. No chemosis, exudate or hemorrhage.    Left eye: Left conjunctiva is injected. No chemosis, exudate or hemorrhage. Cardiovascular:     Rate and Rhythm: Normal rate and regular rhythm.     Pulses: Normal pulses.     Heart sounds: Normal heart sounds.  Pulmonary:     Effort: Pulmonary effort is normal. No respiratory distress.     Breath sounds: Normal breath sounds.  Abdominal:     General: Abdomen is flat. Bowel sounds are normal.     Palpations: Abdomen is soft.     Tenderness: There is no abdominal tenderness.  Skin:    General: Skin is  warm and dry.     Findings: No lesion.  Neurological:     General: No focal deficit present.     Mental Status: She is alert and oriented to person, place, and time. Mental status is at baseline.     ED Results / Procedures / Treatments   Labs (all labs ordered are listed, but only abnormal results are displayed) Labs Reviewed - No data to display  EKG None  Radiology No results found.  Procedures Procedures    Medications Ordered in ED Medications  tetracaine (PONTOCAINE) 0.5 % ophthalmic solution 1 drop (1 drop Both Eyes Given 04/02/24 0953)  fluorescein  ophthalmic strip 1 strip (1 strip Both Eyes Given 04/02/24 0953)    ED Course/ Medical Decision Making/ A&P                                 Medical Decision Making Risk Prescription drug management.   This patient presents to the ED for concern of eye irritation, this  involves an extensive number of treatment options, and is a complaint that carries with it a high risk of complications and morbidity.  The differential diagnosis includes acute glaucoma, corneal abrasion, corneal ulceration,    Problem List / ED Course / Critical interventions / Medication management  Patient presenting to emergency room with complaint of eye irritation.  This has been ongoing for 3 days and is described as foreign body sensation in both eyes but worse in the right.  She noted some draining and itching as well.  My exam is overall reassuring without any evidence of acute glaucoma, acute vision change CVA corneal ulcer or corneal abrasion.  Exam seems most consistent with viral versus allergic conjunctivitis.  She has already been started on antibacterial for presumed bacterial conjunctivitis.  Will have her start over-the-counter artificial tears and antihistamine eyedrops.  She will use good hand hygiene and follow-up with eye doctor soon as possible.  Patient expresses understanding and agrees to the plan.  I have reviewed the patients home medicines and have made adjustments as needed   Plan  F/u w/ PCP in 2-3d to ensure resolution of sx.  Patient was given return precautions. Patient stable for discharge at this time.  Patient educated on sx/dx and verbalized understanding of plan. Return to ER w/ new or worsening sx. ]         Final Clinical Impression(s) / ED Diagnoses Final diagnoses:  Acute viral conjunctivitis of right eye    Rx / DC Orders ED Discharge Orders          Ordered    olopatadine (PATANOL) 0.1 % ophthalmic solution  2 times daily        04/02/24 1101              Maxi Rodas, Kandace Organ, PA-C 04/02/24 1102    Iva Mariner, MD 04/02/24 [phone]

## 2024-04-02 NOTE — Discharge Instructions (Addendum)
 Please follow-up with an eye doctor as discussed to soon as possible.  You can continue the antibiotic drops that were prescribed to you during your virtual visit.  You can continue using eyedrops for artificial tears.  I have sent another eyedrop which you can use --it is an antihistamine.  If this furthers your eye irritation or causes any side effects please stop. Turn to ER with new or worsening symptoms.  Use good hand hygiene to try and avoid rubbing your eyes.

## 2024-04-17 DIAGNOSIS — Z809 Family history of malignant neoplasm, unspecified: Secondary | ICD-10-CM | POA: Diagnosis not present

## 2024-07-06 ENCOUNTER — Encounter: Payer: Self-pay | Admitting: Medical

## 2024-07-06 ENCOUNTER — Ambulatory Visit: Payer: BLUE CROSS/BLUE SHIELD | Admitting: Medical

## 2024-07-06 VITALS — BP 124/82 | HR 80 | Ht 69.0 in | Wt 235.4 lb

## 2024-07-06 DIAGNOSIS — E88819 Insulin resistance, unspecified: Secondary | ICD-10-CM | POA: Diagnosis not present

## 2024-07-06 DIAGNOSIS — F419 Anxiety disorder, unspecified: Secondary | ICD-10-CM | POA: Diagnosis not present

## 2024-07-06 DIAGNOSIS — Z6834 Body mass index (BMI) 34.0-34.9, adult: Secondary | ICD-10-CM | POA: Insufficient documentation

## 2024-07-06 DIAGNOSIS — K029 Dental caries, unspecified: Secondary | ICD-10-CM

## 2024-07-06 DIAGNOSIS — Z0000 Encounter for general adult medical examination without abnormal findings: Secondary | ICD-10-CM | POA: Diagnosis not present

## 2024-07-06 DIAGNOSIS — Z13220 Encounter for screening for lipoid disorders: Secondary | ICD-10-CM

## 2024-07-06 DIAGNOSIS — Z1329 Encounter for screening for other suspected endocrine disorder: Secondary | ICD-10-CM

## 2024-07-06 DIAGNOSIS — R7303 Prediabetes: Secondary | ICD-10-CM | POA: Insufficient documentation

## 2024-07-06 DIAGNOSIS — E282 Polycystic ovarian syndrome: Secondary | ICD-10-CM | POA: Diagnosis not present

## 2024-07-06 MED ORDER — WEGOVY 0.5 MG/0.5ML ~~LOC~~ SOAJ: 0.5000 mg | SUBCUTANEOUS | 0 refills | Status: AC

## 2024-07-06 MED ORDER — SERTRALINE HCL 50 MG PO TABS: 50.0000 mg | ORAL_TABLET | Freq: Every day | ORAL | 1 refills | Status: AC

## 2024-07-06 MED ORDER — WEGOVY 0.25 MG/0.5ML ~~LOC~~ SOAJ: 0.2500 mg | SUBCUTANEOUS | 0 refills | Status: AC

## 2024-07-06 NOTE — Progress Notes (Signed)
 Subjective:   HPI  [name] is a 33 y.o. female who presents for Chief Complaint  Patient presents with   Annual Exam    Cpe. Fasting.  Wants to see about trying to get back on the wegovy , also wants to talk about anxiety meds.     Patient Care Team: Abe Schools, Alm GORMAN RIGGERS as PCP - General (Family Medicine) Ob/Gyn, [facility] Sees dentist Sees eye doctor OB/Gyn, Dr. Olivia Luna   Concerns: Here for physical, fasting  Wants a medication to help with anxiety.  Is overwhelmed as a Social research officer, government.   Using smoking to cope.  Has been on medication in past for similar.  Was on Zoloft  in past and did ok on this.   Not seeing a counselor.    Wants to go on wegovy  to help with weight loss.  No family history of thyroid cancer.  Exercising 3-4 days per week.    Reviewed their medical, surgical, family, social, medication, and allergy history and updated chart as appropriate.  Past Medical History:  Diagnosis Date   GERD (gastroesophageal reflux disease)    Kikuchi disease    PCOS (polycystic ovarian syndrome) 2022   Sinusitis     Family History  Problem Relation Age of Onset   Cancer Mother        cervical   Diabetes Mother    Hypertension Father    Diabetes Father    Lupus Sister    Cancer Brother        bone   Cancer Paternal Aunt        breast   Heart disease Maternal Grandmother    Heart failure Other      Current Outpatient Medications:    omeprazole  (PRILOSEC) 40 MG capsule, Take 1 capsule (40 mg total) by mouth daily., Disp: 90 capsule, Rfl: 1   semaglutide -weight management (WEGOVY ) 0.25 MG/0.5ML SOAJ SQ injection, Inject 0.25 mg into the skin once a week., Disp: 2 mL, Rfl: 0   semaglutide -weight management (WEGOVY ) 0.5 MG/0.5ML SOAJ SQ injection, Inject 0.5 mg into the skin once a week., Disp: 2 mL, Rfl: 0   sertraline  (ZOLOFT ) 50 MG tablet, Take 1 tablet (50 mg total) by mouth daily., Disp: 30 tablet, Rfl: 1   WEGOVY  0.25 MG/0.5ML SOAJ, INJECT 0.25  MG INTO THE SKIN ONE TIME PER WEEK (Patient not taking: Reported on 07/06/2024), Disp: 4 mL, Rfl: 0  No Known Allergies  Review of Systems  Constitutional:  Negative for chills, fever, malaise/fatigue and weight loss.  HENT:  Negative for congestion, ear pain, hearing loss, sore throat and tinnitus.   Eyes:  Negative for blurred vision, pain and redness.  Respiratory:  Negative for cough, hemoptysis and shortness of breath.   Cardiovascular:  Negative for chest pain, palpitations, orthopnea, claudication and leg swelling.  Gastrointestinal:  Negative for abdominal pain, blood in stool, constipation, diarrhea, nausea and vomiting.  Genitourinary:  Negative for dysuria, flank pain, frequency, hematuria and urgency.  Musculoskeletal:  Negative for falls, joint pain and myalgias.  Skin:  Negative for itching and rash.  Neurological:  Negative for dizziness, tingling, speech change, weakness and headaches.  Endo/Heme/Allergies:  Negative for polydipsia. Does not bruise/bleed easily.  Psychiatric/Behavioral:  Negative for depression and memory loss. The patient is nervous/anxious. The patient does not have insomnia.         07/06/2024    9:07 AM 07/06/2023    9:35 AM 06/30/2022    2:14 PM 05/18/2022   11:40 AM  Depression screen PHQ 2/9  Decreased Interest 0 0 0 0  Down, Depressed, Hopeless 0 0 0 0  PHQ - 2 Score 0 0 0 0       Objective:  BP 124/82   Pulse 80   Ht 5' 9 (1.753 m)   Wt 235 lb 6.4 oz (106.8 kg)   SpO2 99%   BMI 34.76 kg/m   Wt Readings from Last 3 Encounters:  07/06/24 235 lb 6.4 oz (106.8 kg)  04/02/24 240 lb (108.9 kg)  12/05/23 255 lb (115.7 kg)   BP Readings from Last 3 Encounters:  07/06/24 124/82  04/02/24 130/87  12/05/23 113/72    General appearance: alert, no distress, WD/WN, African American female Skin: few skin tags of bilat face and lower eyelids left and right, tattoo left and right forearm, HEENT: normocephalic, conjunctiva/corneas normal,  sclerae anicteric, PERRLA, EOMi, nares patent, no discharge or erythema, pharynx normal Oral cavity: MMM, tongue normal, teeth with some decay of posterior molars upper and lower Neck: supple, no lymphadenopathy, mild general thyromegaly, no masses, normal ROM, no bruits Chest: non tender, normal shape and expansion Heart: RRR, normal S1, S2, no murmurs Lungs: CTA bilaterally, no wheezes, rhonchi, or rales Abdomen: +bs, soft, non tender, non distended, no masses, no hepatomegaly, no splenomegaly, no bruits Back: non tender, normal ROM, no scoliosis Musculoskeletal: upper extremities non tender, no obvious deformity, normal ROM throughout, lower extremities non tender, no obvious deformity, normal ROM throughout Extremities: no edema, no cyanosis, no clubbing Pulses: 2+ symmetric, upper and lower extremities, normal cap refill Neurological: alert, oriented x 3, CN2-12 intact, strength normal upper extremities and lower extremities, sensation normal throughout, DTRs 2+ throughout, no cerebellar signs, gait normal Psychiatric: normal affect, behavior normal, pleasant  Breast/gyn/rectal - deferred to gynecology    Assessment and Plan :   Encounter Diagnoses  Name Primary?   Encounter for health maintenance examination in adult Yes   Screening for lipid disorders    Insulin  resistance    PCOS (polycystic ovarian syndrome)    Prediabetes    Screening for thyroid disorder    Anxiety    Tooth decay      This visit was a preventative care visit, also known as wellness visit or routine physical.   Topics typically include healthy lifestyle, diet, exercise, preventative care, vaccinations, sick and well care, proper use of emergency dept and after hours care, as well as other concerns.     Recommendations: Continue to return yearly for your annual wellness and preventative care visits.  This gives us  a chance to discuss healthy lifestyle, exercise, vaccinations, review your chart record, and  perform screenings where appropriate.  I recommend you see your eye doctor yearly for routine vision care.  I recommend you see your dentist yearly for routine dental care including hygiene visits twice yearly.  See your gynecologist yearly for routine gynecological care.    Vaccination  Immunization History  Administered Date(s) Administered   DTaP 12/19/1991, 03/07/1992, 02/10/1994   HIB (PRP-OMP) 12/19/1991, 03/07/1992   IPV 12/19/1991, 03/07/1992, 02/10/1994   MMR 02/10/1994   Moderna Sars-Covid-2 Vaccination 01/29/2020, 02/26/2020, 02/14/2021   PFIZER Comirnaty(Gray Top)Covid-19 Tri-Sucrose Vaccine 01/29/2020, 02/26/2020, 02/22/2021   Pneumococcal Polysaccharide-23 02/04/2021   Tdap 02/04/2021   Advised yearly flu shot    Screening for cancer: Colon cancer screening: Age 78  Breast cancer screening: You should perform a self breast exam monthly.   We reviewed recommendations for regular mammograms and breast cancer screening.  Cervical cancer  screening: We reviewed recommendations for pap smear screening.   Skin cancer screening: Check your skin regularly for new changes, growing lesions, or other lesions of concern Come in for evaluation if you have skin lesions of concern.  Lung cancer screening: If you have a greater than 20 pack year history of tobacco use, then you may qualify for lung cancer screening with a chest CT scan.   Please call your insurance company to inquire about coverage for this test.  We currently don't have screenings for other cancers besides breast, cervical, colon, and lung cancers.  If you have a strong family history of cancer or have other cancer screening concerns, please let me know.    Bone health: Get at least 150 minutes of aerobic exercise weekly Get weight bearing exercise at least once weekly Bone density test:  A bone density test is an imaging test that uses a type of X-ray to measure the amount of calcium and other  minerals in your bones. The test may be used to diagnose or screen you for a condition that causes weak or thin bones (osteoporosis), predict your risk for a broken bone (fracture), or determine how well your osteoporosis treatment is working. The bone density test is recommended for females 65 and older, or females or males <65 if certain risk factors such as thyroid disease, long term use of steroids such as for asthma or rheumatological issues, vitamin D  deficiency, estrogen deficiency, family history of osteoporosis, self or family history of fragility fracture in first degree relative.    Heart health: Get at least 150 minutes of aerobic exercise weekly Limit alcohol It is important to maintain a healthy blood pressure and healthy cholesterol numbers  Heart disease screening: Screening for heart disease includes screening for blood pressure, fasting lipids, glucose/diabetes screening, BMI height to weight ratio, reviewed of smoking status, physical activity, and diet.    Goals include blood pressure 120/80 or less, maintaining a healthy lipid/cholesterol profile, preventing diabetes or keeping diabetes numbers under good control, not smoking or using tobacco products, exercising most days per week or at least 150 minutes per week of exercise, and eating healthy variety of fruits and vegetables, healthy oils, and avoiding unhealthy food choices like fried food, fast food, high sugar and high cholesterol foods.    Other tests may possibly include EKG test, CT coronary calcium score, echocardiogram, exercise treadmill stress test.   Reviewed 11/2021 EKG on file, normal   Medical care options: I recommend you continue to seek care here first for routine care.  We try really hard to have available appointments Monday through Friday daytime hours for sick visits, acute visits, and physicals.  Urgent care should be used for after hours and weekends for significant issues that cannot wait till the  next day.  The emergency department should be used for significant potentially life-threatening emergencies.  The emergency department is expensive, can often have long wait times for less significant concerns, so try to utilize primary care, urgent care, or telemedicine when possible to avoid unnecessary trips to the emergency department.  Virtual visits and telemedicine have been introduced since the pandemic started in 2020, and can be convenient ways to receive medical care.  We offer virtual appointments as well to assist you in a variety of options to seek medical care.   Advanced Directives: I recommend you consider completing a Health Care Power of Attorney and Living Will.   These documents respect your wishes and help alleviate burdens on  your loved ones if you were to become terminally ill or be in a position to need those documents enforced.    You can complete Advanced Directives yourself, have them notarized, then have copies made for our office, for you and for anybody you feel should have them in safe keeping.  Or, you can have an attorney prepare these documents.   If you haven't updated your Last Will and Testament in a while, it may be worthwhile having an attorney prepare these documents together and save on some costs.       Separate significant issues discussed: PCOS - on metformin , seeing gynecology  Anxiety - begin trial of Zoloft .  She has done well on this prior. Discussed risks/benefits and proper use of medication  Insulin  resistance - updated labs today  Obesity - she is going to use 1700 calorie per day or less diet.  Increase exercise to 4 days per week.  Begin trial of Wegovy . Discussed risks/benefits and proper use of medication  Tooth decay - f/u with dentist   Indica was seen today for annual exam.  Diagnoses and all orders for this visit:  Encounter for health maintenance examination in adult -     CBC -     Comprehensive metabolic panel with GFR -      Lipid panel -     TSH -     VITAMIN D  25 Hydroxy (Vit-D Deficiency, Fractures) -     Urinalysis, Routine w reflex microscopic -     Hemoglobin A1c  Screening for lipid disorders -     Lipid panel  Insulin  resistance -     Hemoglobin A1c  PCOS (polycystic ovarian syndrome)  Prediabetes -     Hemoglobin A1c  Screening for thyroid disorder -     TSH  Anxiety -     TSH -     VITAMIN D  25 Hydroxy (Vit-D Deficiency, Fractures)  Tooth decay  Other orders -     semaglutide -weight management (WEGOVY ) 0.25 MG/0.5ML SOAJ SQ injection; Inject 0.25 mg into the skin once a week. -     semaglutide -weight management (WEGOVY ) 0.5 MG/0.5ML SOAJ SQ injection; Inject 0.5 mg into the skin once a week. -     sertraline  (ZOLOFT ) 50 MG tablet; Take 1 tablet (50 mg total) by mouth daily.     Follow-up pending labs, follow up in 6-8 weeks on medications, yearly for physical

## 2024-07-07 LAB — URINALYSIS, ROUTINE W REFLEX MICROSCOPIC
Nitrite, UA: POSITIVE — AB
Specific Gravity, UA: 1.025 (ref 1.005–1.030)
Urobilinogen, Ur: 1 mg/dL (ref 0.2–1.0)
pH, UA: 6 (ref 5.0–7.5)

## 2024-07-07 LAB — LIPID PANEL
Chol/HDL Ratio: 4 ratio (ref 0.0–4.4)
Cholesterol, Total: 164 mg/dL (ref 100–199)
HDL: 41 mg/dL (ref >39)
LDL Chol Calc (NIH): 109 mg/dL — ABNORMAL HIGH (ref 0–99)
Triglycerides: 74 mg/dL (ref 0–149)
VLDL Cholesterol Cal: 14 mg/dL (ref 5–40)

## 2024-07-07 LAB — COMPREHENSIVE METABOLIC PANEL WITH GFR
ALT: 16 IU/L (ref 0–32)
AST: 17 IU/L (ref 0–40)
Albumin: 4.4 g/dL (ref 3.9–4.9)
Alkaline Phosphatase: 38 IU/L — ABNORMAL LOW (ref 44–121)
BUN/Creatinine Ratio: 14 (ref 9–23)
BUN: 9 mg/dL (ref 6–20)
Bilirubin Total: 0.5 mg/dL (ref 0.0–1.2)
CO2: 19 mmol/L — ABNORMAL LOW (ref 20–29)
Calcium: 9.4 mg/dL (ref 8.7–10.2)
Chloride: 105 mmol/L (ref 96–106)
Creatinine, Ser: 0.66 mg/dL (ref 0.57–1.00)
Globulin, Total: 3.3 g/dL (ref 1.5–4.5)
Glucose: 80 mg/dL (ref 70–99)
Potassium: 4.3 mmol/L (ref 3.5–5.2)
Sodium: 140 mmol/L (ref 134–144)
Total Protein: 7.7 g/dL (ref 6.0–8.5)
eGFR: 119 mL/min/1.73 (ref >59)

## 2024-07-07 LAB — CBC
Hematocrit: 38.6 % (ref 34.0–46.6)
Hemoglobin: 12.2 g/dL (ref 11.1–15.9)
MCH: 25.9 pg — ABNORMAL LOW (ref 26.6–33.0)
MCHC: 31.6 g/dL (ref 31.5–35.7)
MCV: 82 fL (ref 79–97)
Platelets: 234 x10E3/uL (ref 150–450)
RBC: 4.71 x10E6/uL (ref 3.77–5.28)
RDW: 14.6 % (ref 11.7–15.4)
WBC: 4 x10E3/uL (ref 3.4–10.8)

## 2024-07-07 LAB — HEMOGLOBIN A1C
Est. average glucose Bld gHb Est-mCnc: 108 mg/dL
Hgb A1c MFr Bld: 5.4 % (ref 4.8–5.6)

## 2024-07-07 LAB — MICROSCOPIC EXAMINATION: Casts: NONE SEEN /LPF

## 2024-07-07 LAB — VITAMIN D 25 HYDROXY (VIT D DEFICIENCY, FRACTURES): Vit D, 25-Hydroxy: 20.3 ng/mL — AB (ref 30.0–100.0)

## 2024-07-07 LAB — TSH: TSH: 1.22 u[IU]/mL (ref 0.450–4.500)

## 2024-07-09 ENCOUNTER — Ambulatory Visit: Payer: Self-pay | Admitting: Medical

## 2024-07-09 ENCOUNTER — Other Ambulatory Visit: Payer: Self-pay | Admitting: Medical

## 2024-07-09 MED ORDER — VITAMIN D (ERGOCALCIFEROL) 1.25 MG (50000 UNIT) PO CAPS: 50000.0000 [IU] | ORAL_CAPSULE | ORAL | 0 refills | Status: AC

## 2024-07-09 NOTE — Progress Notes (Signed)
 Results thru my chart

## 2024-07-12 ENCOUNTER — Telehealth: Payer: Self-pay

## 2024-07-12 ENCOUNTER — Other Ambulatory Visit (HOSPITAL_COMMUNITY): Payer: Self-pay

## 2024-07-12 NOTE — Telephone Encounter (Signed)
 Pharmacy Patient Advocate Encounter   Received notification from Onbase that prior authorization for Wegovy  0.25MG /0.5ML auto-injectors  is required/requested.   Insurance verification completed.   The patient is insured through [facility] .   Per test claim: PA required; PA submitted to above mentioned insurance via Latent Key/confirmation #/EOC [id number]   Status is pending

## 2024-07-13 NOTE — Telephone Encounter (Signed)
 Pharmacy Patient Advocate Encounter  Received notification from [employer] that Prior Authorization for Wegovy  0.25MG /0.5ML auto-injectors  has been APPROVED to 3.3.26. Ran test claim, Copay is $0.00. This test claim was processed through [facility]- copay amounts may vary at other pharmacies due to pharmacy/plan contracts, or as the patient moves through the different stages of their insurance plan.  PA #/Case ID/Reference #: [id number]

## 2024-08-03 ENCOUNTER — Ambulatory Visit: Payer: Self-pay

## 2024-08-03 NOTE — Telephone Encounter (Signed)
 FYI Only or Action Required?: FYI only for provider.  Patient was last seen in primary care on 07/06/2024 by Bulah Alm RAMAN, PA-C.  Called Nurse Triage reporting Sore Throat. Pt also report BA and exposure to STI.  Symptoms began several days ago.  Interventions attempted: OTC medications: cold medicine.  Symptoms are: unchanged.  Triage Disposition: See PCP When Office is Open (Within 3 Days), See Physician Within 24 Hours  Patient/caregiver understands and will follow disposition?: Yes               Copied from CRM #[id number]. Topic: Clinical - Red Word Triage >> Aug 03, 2024 11:49 AM Antwanette L wrote: Red Word that prompted transfer to Nurse Triage: Pt is having body aches and a sore throat. Pt is requesting an sti test Reason for Disposition  History of rheumatic fever  [1] Sore throat is the only symptom AND [2] present > 48 hours  Answer Assessment - Initial Assessment Questions 1. ONSET: When did the throat start hurting? (Hours or days ago)      3 days 2. SEVERITY: How bad is the sore throat? (Scale 1-10; mild, moderate or severe)     Mild   4.  VIRAL SYMPTOMS: Are there any symptoms of a cold, such as a runny nose, cough, hoarse voice or red eyes?      BA 7. OTHER SYMPTOMS: Do you have any other symptoms? (e.g., difficulty breathing, headache, rash)     BA  Protocols used: Sore Throat-A-AH

## 2024-08-04 ENCOUNTER — Ambulatory Visit: Admitting: Internal Medicine

## 2024-08-10 ENCOUNTER — Ambulatory Visit: Admitting: Medical

## 2024-09-30 ENCOUNTER — Other Ambulatory Visit: Payer: Self-pay

## 2024-09-30 ENCOUNTER — Ambulatory Visit
Admission: EM | Admit: 2024-09-30 | Discharge: 2024-09-30 | Disposition: A | Discharge status: Home / Self Care | Attending: Internal Medicine | Admitting: Internal Medicine

## 2024-09-30 ENCOUNTER — Ambulatory Visit (INDEPENDENT_AMBULATORY_CARE_PROVIDER_SITE_OTHER)

## 2024-09-30 ENCOUNTER — Encounter: Payer: Self-pay | Admitting: Emergency Medicine

## 2024-09-30 DIAGNOSIS — M25561 Pain in right knee: Secondary | ICD-10-CM | POA: Diagnosis not present

## 2024-09-30 MED ORDER — IBUPROFEN 800 MG PO TABS: 800.0000 mg | ORAL_TABLET | Freq: Three times a day (TID) | ORAL | 0 refills | Status: AC | PRN

## 2024-09-30 MED ORDER — KETOROLAC TROMETHAMINE 30 MG/ML IJ SOLN
30.0000 mg | Freq: Once | INTRAMUSCULAR | Status: AC
  Administered 2024-09-30: 30 mg via INTRAMUSCULAR

## 2024-09-30 MED ORDER — DEXAMETHASONE SOD PHOSPHATE PF 10 MG/ML IJ SOLN
10.0000 mg | Freq: Once | INTRAMUSCULAR | Status: AC
  Administered 2024-09-30: 10 mg via INTRAMUSCULAR

## 2024-09-30 NOTE — ED Triage Notes (Signed)
 Pt sts right knee pain with swelling x 3 days; denies injury

## 2024-09-30 NOTE — Discharge Instructions (Addendum)
 X-ray of the right knee done today.  Final evaluation by the radiologist does not show any acute findings.  Symptoms and physical exam findings are most consistent with inflammation and the right knee.  We can treat this with a stronger anti-inflammatory and rest and if no improvement then will need to follow-up with an orthopedist.  We will treat with the following: Toradol  injection given today. This is a medication to help with pain. This is not a narcotic.  Decadron  injection given today. This is a steroid to help with inflammation and pain. Starting tomorrow, 11/23 ibuprofen  800 mg every 8 hours as needed for pain Ice the area 2-3 times daily for 10-15 minutes to help with pain and swelling. Do not apply ice directly to the skin.   Light stretching to improve mobility. May alternate heat and ice to help with symptoms.   Wear hinged knee brace during the day when you are up standing or walking If you are not having any significant improvement in 3 to 4 days then may need to follow-up with an orthopedist May follow-up at urgent care as needed

## 2024-09-30 NOTE — ED Provider Notes (Signed)
 [facility]    CSN: [id number] Arrival date & time: 09/30/24  1300      History   Chief Complaint Chief Complaint  Patient presents with   Knee Pain    HPI [name] is a 33 y.o. female.   33 year old female who presents urgent [facility] with complaints of right knee pain.  This started about 2 to 3 days ago.  She did not have any specific injury to the knee.  She has noted a little bit of swelling as well.  The pain is much worse when she was up walking.  The pain is located mostly around the lateral aspect and the superior aspect of the knee.  She is on her feet a lot at work.  She has been wearing a knee brace to try and help the symptoms.   Knee Pain Associated symptoms: no back pain and no fever     Past Medical History:  Diagnosis Date   Anxiety 07/06/2024   GERD (gastroesophageal reflux disease)    Kikuchi disease    PCOS (polycystic ovarian syndrome) 2022   Sinusitis     Patient Active Problem List   Diagnosis Date Noted   BMI 34.0-34.9,adult 07/06/2024   Prediabetes 07/06/2024   Anxiety 07/06/2024   Vaccine refused by patient 07/06/2023   Encounter for surveillance of contraceptives 07/06/2023   Encounter for health maintenance examination in adult 06/30/2022   Screening for lipid disorders 06/30/2022   PCOS (polycystic ovarian syndrome) 05/18/2022   Insulin  resistance 05/18/2022    Past Surgical History:  Procedure Laterality Date   LYMPHADENECTOMY  2002   neck   UVULECTOMY      OB History     Gravida  0   Para  0   Term  0   Preterm  0   AB  0   Living  0      SAB  0   IAB  0   Ectopic  0   Multiple  0   Live Births  0            Home Medications    Prior to Admission medications   Medication Sig Start Date End Date Taking? Authorizing Provider  ibuprofen  (ADVIL ) 800 MG tablet Take 1 tablet (800 mg total) by mouth every 8 (eight) hours as needed. 09/30/24  Yes Yatzari Jonsson A, PA-C  Vitamin D ,  Ergocalciferol , (DRISDOL ) 1.25 MG (50000 UNIT) CAPS capsule Take 1 capsule (50,000 Units total) by mouth every 7 (seven) days. 07/09/24   Tysinger, Alm RAMAN, PA-C  omeprazole  (PRILOSEC) 40 MG capsule Take 1 capsule (40 mg total) by mouth daily. 10/05/22   Tysinger, Alm RAMAN, PA-C  semaglutide -weight management (WEGOVY ) 0.25 MG/0.5ML SOAJ SQ injection Inject 0.25 mg into the skin once a week. 07/06/24   Tysinger, Alm RAMAN, PA-C  semaglutide -weight management (WEGOVY ) 0.5 MG/0.5ML SOAJ SQ injection Inject 0.5 mg into the skin once a week. 07/06/24   Tysinger, Alm RAMAN, PA-C  sertraline  (ZOLOFT ) 50 MG tablet Take 1 tablet (50 mg total) by mouth daily. 07/06/24   Tysinger, Alm RAMAN, PA-C  WEGOVY  0.25 MG/0.5ML SOAJ INJECT 0.25 MG INTO THE SKIN ONE TIME PER WEEK Patient not taking: Reported on 07/06/2024 07/07/23   Tysinger, Alm RAMAN, PA-C    Family History Family History  Problem Relation Age of Onset   Cancer Mother        cervical   Diabetes Mother    Hypertension Father    Diabetes Father  Lupus Sister    Cancer Brother        bone   Cancer Paternal Aunt        breast   Heart disease Maternal Grandmother    Heart failure Other     Social History Social History   Tobacco Use   Smoking status: Every Day    Current packs/day: 0.25    Average packs/day: 0.3 packs/day for 0.9 years (0.2 ttl pk-yrs)    Types: Cigarettes    Start date: 2025   Smokeless tobacco: Never  Vaping Use   Vaping status: Never Used  Substance Use Topics   Alcohol use: Not Currently    Alcohol/week: 7.0 standard drinks of alcohol    Types: 7 Shots of liquor per week    Comment: 2 times a  week    Drug use: Not Currently    Types: Marijuana     Allergies   Patient has no known allergies.   Review of Systems Review of Systems  Constitutional:  Negative for chills and fever.  HENT:  Negative for ear pain and sore throat.   Eyes:  Negative for pain and visual disturbance.  Respiratory:  Negative for cough and  shortness of breath.   Cardiovascular:  Negative for chest pain and palpitations.  Gastrointestinal:  Negative for abdominal pain and vomiting.  Genitourinary:  Negative for dysuria and hematuria.  Musculoskeletal:  Negative for arthralgias and back pain.       Right knee pain  Skin:  Negative for color change and rash.  Neurological:  Negative for seizures and syncope.  All other systems reviewed and are negative.    Physical Exam Triage Vital Signs ED Triage Vitals  Encounter Vitals Group     BP 09/30/24 1346 116/77     Girls Systolic BP Percentile --      Girls Diastolic BP Percentile --      Boys Systolic BP Percentile --      Boys Diastolic BP Percentile --      Pulse Rate 09/30/24 1346 81     Resp 09/30/24 1346 18     Temp 09/30/24 1346 99.4 F (37.4 C)     Temp Source 09/30/24 1346 Oral     SpO2 09/30/24 1346 97 %     Weight --      Height --      Head Circumference --      Peak Flow --      Pain Score 09/30/24 1347 5     Pain Loc --      Pain Education --      Exclude from Growth Chart --    No data found.  Updated Vital Signs BP 116/77 (BP Location: Right Arm)   Pulse 81   Temp 99.4 F (37.4 C) (Oral)   Resp 18   SpO2 97%   Visual Acuity Right Eye Distance:   Left Eye Distance:   Bilateral Distance:    Right Eye Near:   Left Eye Near:    Bilateral Near:     Physical Exam Vitals and nursing note reviewed.  Constitutional:      General: She is not in acute distress.    Appearance: She is well-developed.  HENT:     Head: Normocephalic and atraumatic.  Eyes:     Conjunctiva/sclera: Conjunctivae normal.  Cardiovascular:     Rate and Rhythm: Normal rate and regular rhythm.     Heart sounds: No murmur heard. Pulmonary:  Effort: Pulmonary effort is normal. No respiratory distress.     Breath sounds: Normal breath sounds.  Abdominal:     Palpations: Abdomen is soft.     Tenderness: There is no abdominal tenderness.  Musculoskeletal:         General: No swelling.     Cervical back: Neck supple.     Right knee: Swelling (Small amount) present. Tenderness present over the lateral joint line and patellar tendon. No LCL laxity, MCL laxity, ACL laxity or PCL laxity. Normal pulse.     Instability Tests: Anterior drawer test negative. Posterior drawer test negative. Medial McMurray test negative and lateral McMurray test negative.     Comments: Pain with extension and flexion  Skin:    General: Skin is warm and dry.     Capillary Refill: Capillary refill takes less than 2 seconds.  Neurological:     Mental Status: She is alert.  Psychiatric:        Mood and Affect: Mood normal.      UC Treatments / Results  Labs (all labs ordered are listed, but only abnormal results are displayed) Labs Reviewed - No data to display  EKG   Radiology DG Knee Complete 4 Views Right Result Date: 09/30/2024 CLINICAL DATA:  right knee pain especially with walking around the lateral and superior aspect EXAM: RIGHT KNEE - COMPLETE 4+ VIEW COMPARISON:  None Available. FINDINGS: No acute fracture or dislocation. Joint spaces and alignment are maintained. No area of erosion or osseous destruction. No unexpected radiopaque foreign body. Soft tissues are unremarkable. IMPRESSION: No acute fracture or dislocation. Electronically Signed   By: Corean Salter M.D.   On: 09/30/2024 14:13    Procedures Procedures (including critical [facility] time)  Medications Ordered in UC Medications  ketorolac  (TORADOL ) 30 MG/ML injection 30 mg (has no administration in time range)  dexamethasone  (DECADRON ) injection 10 mg (has no administration in time range)    Initial Impression / Assessment and Plan / UC Course  I have reviewed the triage vital signs and the nursing notes.  Pertinent labs & imaging results that were available during my [facility] of the patient were reviewed by me and considered in my medical decision making (see chart for details).     Acute pain  of right knee - Plan: DG Knee Complete 4 Views Right, DG Knee Complete 4 Views Right   X-ray of the right knee done today.  Final evaluation by the radiologist does not show any acute findings.  Symptoms and physical exam findings are most consistent with inflammation and the right knee.  We can treat this with a stronger anti-inflammatory and rest and if no improvement then will need to follow-up with an orthopedist.  We will treat with the following: Toradol  injection given today. This is a medication to help with pain. This is not a narcotic.  Decadron  injection given today. This is a steroid to help with inflammation and pain. Starting tomorrow, 11/23 ibuprofen  800 mg every 8 hours as needed for pain Ice the area 2-3 times daily for 10-15 minutes to help with pain and swelling. Do not apply ice directly to the skin.   Light stretching to improve mobility. May alternate heat and ice to help with symptoms.   Wear hinged knee brace during the day when you are up standing or walking If you are not having any significant improvement in 3 to 4 days then may need to follow-up with an orthopedist May follow-up at urgent [facility]  as needed  Final Clinical Impressions(s) / UC Diagnoses   Final diagnoses:  Acute pain of right knee     Discharge Instructions      X-ray of the right knee done today.  Final evaluation by the radiologist does not show any acute findings.  Symptoms and physical exam findings are most consistent with inflammation and the right knee.  We can treat this with a stronger anti-inflammatory and rest and if no improvement then will need to follow-up with an orthopedist.  We will treat with the following: Toradol  injection given today. This is a medication to help with pain. This is not a narcotic.  Decadron  injection given today. This is a steroid to help with inflammation and pain. Starting tomorrow, 11/23 ibuprofen  800 mg every 8 hours as needed for pain Ice the area 2-3 times  daily for 10-15 minutes to help with pain and swelling. Do not apply ice directly to the skin.   Light stretching to improve mobility. May alternate heat and ice to help with symptoms.   Wear hinged knee brace during the day when you are up standing or walking If you are not having any significant improvement in 3 to 4 days then may need to follow-up with an orthopedist May follow-up at urgent [facility] as needed     ED Prescriptions     Medication Sig Dispense Auth. Provider   ibuprofen  (ADVIL ) 800 MG tablet Take 1 tablet (800 mg total) by mouth every 8 (eight) hours as needed. 21 tablet Teresa Almarie LABOR, PA-C      PDMP not reviewed this encounter.   Teresa Almarie LABOR, PA-C 09/30/24 1432

## 2024-12-13 ENCOUNTER — Other Ambulatory Visit (HOSPITAL_COMMUNITY): Payer: Self-pay
